# Patient Record
Sex: Female | Born: 1960 | Race: Black or African American | Hispanic: No | Marital: Single | Smoking: Former smoker
Health system: Southern US, Community
[De-identification: ages and names within clinical notes are randomized; demographics above are authoritative.]

## PROBLEM LIST (undated history)

## (undated) DIAGNOSIS — H409 Unspecified glaucoma: Secondary | ICD-10-CM

## (undated) DIAGNOSIS — I639 Cerebral infarction, unspecified: Secondary | ICD-10-CM

## (undated) DIAGNOSIS — I10 Essential (primary) hypertension: Secondary | ICD-10-CM

## (undated) DIAGNOSIS — E7800 Pure hypercholesterolemia, unspecified: Secondary | ICD-10-CM

## (undated) DIAGNOSIS — Z970 Presence of artificial eye: Secondary | ICD-10-CM

## (undated) DIAGNOSIS — E119 Type 2 diabetes mellitus without complications: Secondary | ICD-10-CM

## (undated) HISTORY — PX: OTHER SURGICAL HISTORY: SHX169

## (undated) HISTORY — PX: BREAST BIOPSY: SHX20

---

## 2019-04-02 ENCOUNTER — Emergency Department (HOSPITAL_COMMUNITY): Payer: Medicare Other

## 2019-04-02 ENCOUNTER — Encounter (HOSPITAL_COMMUNITY): Payer: Self-pay

## 2019-04-02 ENCOUNTER — Inpatient Hospital Stay (HOSPITAL_COMMUNITY)
Admission: EM | Admit: 2019-04-02 | Discharge: 2019-04-03 | DRG: 066 | Disposition: A | Discharge status: Home / Self Care | Payer: Medicare Other | Attending: Family Medicine | Admitting: Family Medicine

## 2019-04-02 ENCOUNTER — Other Ambulatory Visit: Payer: Self-pay

## 2019-04-02 DIAGNOSIS — I63429 Cerebral infarction due to embolism of unspecified anterior cerebral artery: Secondary | ICD-10-CM

## 2019-04-02 DIAGNOSIS — Z20828 Contact with and (suspected) exposure to other viral communicable diseases: Secondary | ICD-10-CM | POA: Diagnosis present

## 2019-04-02 DIAGNOSIS — E1151 Type 2 diabetes mellitus with diabetic peripheral angiopathy without gangrene: Secondary | ICD-10-CM | POA: Diagnosis present

## 2019-04-02 DIAGNOSIS — D869 Sarcoidosis, unspecified: Secondary | ICD-10-CM | POA: Diagnosis present

## 2019-04-02 DIAGNOSIS — Z9842 Cataract extraction status, left eye: Secondary | ICD-10-CM | POA: Diagnosis not present

## 2019-04-02 DIAGNOSIS — W1830XA Fall on same level, unspecified, initial encounter: Secondary | ICD-10-CM | POA: Diagnosis present

## 2019-04-02 DIAGNOSIS — H409 Unspecified glaucoma: Secondary | ICD-10-CM | POA: Diagnosis present

## 2019-04-02 DIAGNOSIS — E7800 Pure hypercholesterolemia, unspecified: Secondary | ICD-10-CM | POA: Diagnosis present

## 2019-04-02 DIAGNOSIS — Z880 Allergy status to penicillin: Secondary | ICD-10-CM | POA: Diagnosis not present

## 2019-04-02 DIAGNOSIS — Z8673 Personal history of transient ischemic attack (TIA), and cerebral infarction without residual deficits: Secondary | ICD-10-CM

## 2019-04-02 DIAGNOSIS — I10 Essential (primary) hypertension: Secondary | ICD-10-CM | POA: Diagnosis present

## 2019-04-02 DIAGNOSIS — I63412 Cerebral infarction due to embolism of left middle cerebral artery: Secondary | ICD-10-CM | POA: Diagnosis present

## 2019-04-02 DIAGNOSIS — R29702 NIHSS score 2: Secondary | ICD-10-CM | POA: Diagnosis present

## 2019-04-02 DIAGNOSIS — I361 Nonrheumatic tricuspid (valve) insufficiency: Secondary | ICD-10-CM | POA: Diagnosis not present

## 2019-04-02 DIAGNOSIS — G8311 Monoplegia of lower limb affecting right dominant side: Secondary | ICD-10-CM | POA: Diagnosis present

## 2019-04-02 DIAGNOSIS — Z6823 Body mass index (BMI) 23.0-23.9, adult: Secondary | ICD-10-CM

## 2019-04-02 DIAGNOSIS — Z79899 Other long term (current) drug therapy: Secondary | ICD-10-CM

## 2019-04-02 DIAGNOSIS — Z8249 Family history of ischemic heart disease and other diseases of the circulatory system: Secondary | ICD-10-CM

## 2019-04-02 DIAGNOSIS — R278 Other lack of coordination: Secondary | ICD-10-CM | POA: Diagnosis present

## 2019-04-02 DIAGNOSIS — I071 Rheumatic tricuspid insufficiency: Secondary | ICD-10-CM | POA: Diagnosis present

## 2019-04-02 DIAGNOSIS — R636 Underweight: Secondary | ICD-10-CM | POA: Diagnosis present

## 2019-04-02 DIAGNOSIS — E781 Pure hyperglyceridemia: Secondary | ICD-10-CM | POA: Diagnosis present

## 2019-04-02 DIAGNOSIS — I63422 Cerebral infarction due to embolism of left anterior cerebral artery: Secondary | ICD-10-CM | POA: Diagnosis present

## 2019-04-02 DIAGNOSIS — F17220 Nicotine dependence, chewing tobacco, uncomplicated: Secondary | ICD-10-CM | POA: Diagnosis present

## 2019-04-02 DIAGNOSIS — I6340 Cerebral infarction due to embolism of unspecified cerebral artery: Secondary | ICD-10-CM | POA: Insufficient documentation

## 2019-04-02 DIAGNOSIS — Z90710 Acquired absence of both cervix and uterus: Secondary | ICD-10-CM | POA: Diagnosis not present

## 2019-04-02 DIAGNOSIS — E1165 Type 2 diabetes mellitus with hyperglycemia: Secondary | ICD-10-CM | POA: Diagnosis present

## 2019-04-02 DIAGNOSIS — Z970 Presence of artificial eye: Secondary | ICD-10-CM

## 2019-04-02 DIAGNOSIS — I639 Cerebral infarction, unspecified: Secondary | ICD-10-CM | POA: Diagnosis present

## 2019-04-02 DIAGNOSIS — E785 Hyperlipidemia, unspecified: Secondary | ICD-10-CM | POA: Diagnosis present

## 2019-04-02 DIAGNOSIS — E1159 Type 2 diabetes mellitus with other circulatory complications: Secondary | ICD-10-CM | POA: Diagnosis not present

## 2019-04-02 HISTORY — DX: Essential (primary) hypertension: I10

## 2019-04-02 HISTORY — DX: Presence of artificial eye: Z97.0

## 2019-04-02 HISTORY — DX: Type 2 diabetes mellitus without complications: E11.9

## 2019-04-02 HISTORY — DX: Pure hypercholesterolemia, unspecified: E78.00

## 2019-04-02 HISTORY — DX: Unspecified glaucoma: H40.9

## 2019-04-02 HISTORY — DX: Cerebral infarction, unspecified: I63.9

## 2019-04-02 LAB — RAPID URINE DRUG SCREEN, HOSP PERFORMED
Amphetamines: NOT DETECTED
Barbiturates: NOT DETECTED
Benzodiazepines: NOT DETECTED
Cocaine: NOT DETECTED
Opiates: NOT DETECTED
Tetrahydrocannabinol: NOT DETECTED

## 2019-04-02 LAB — CBC
HCT: 42 % (ref 36.0–46.0)
Hemoglobin: 13.9 g/dL (ref 12.0–15.0)
MCH: 30.3 pg (ref 26.0–34.0)
MCHC: 33.1 g/dL (ref 30.0–36.0)
MCV: 91.7 fL (ref 80.0–100.0)
Platelets: 353 10*3/uL (ref 150–400)
RBC: 4.58 MIL/uL (ref 3.87–5.11)
RDW: 12.7 % (ref 11.5–15.5)
WBC: 10 10*3/uL (ref 4.0–10.5)
nRBC: 0 % (ref 0.0–0.2)

## 2019-04-02 LAB — COMPREHENSIVE METABOLIC PANEL
ALT: 32 U/L (ref 0–44)
AST: 22 U/L (ref 15–41)
Albumin: 3.5 g/dL (ref 3.5–5.0)
Alkaline Phosphatase: 103 U/L (ref 38–126)
Anion gap: 12 (ref 5–15)
BUN: 15 mg/dL (ref 6–20)
CO2: 25 mmol/L (ref 22–32)
Calcium: 9.3 mg/dL (ref 8.9–10.3)
Chloride: 102 mmol/L (ref 98–111)
Creatinine, Ser: 0.88 mg/dL (ref 0.44–1.00)
GFR calc Af Amer: >60 mL/min (ref >60)
GFR calc non Af Amer: >60 mL/min (ref >60)
Glucose, Bld: 172 mg/dL — ABNORMAL HIGH (ref 70–99)
Potassium: 4.1 mmol/L (ref 3.5–5.1)
Sodium: 139 mmol/L (ref 135–145)
Total Bilirubin: 0.6 mg/dL (ref 0.3–1.2)
Total Protein: 7.5 g/dL (ref 6.5–8.1)

## 2019-04-02 LAB — DIFFERENTIAL
Abs Immature Granulocytes: 0.05 10*3/uL (ref 0.00–0.07)
Basophils Absolute: 0.1 10*3/uL (ref 0.0–0.1)
Basophils Relative: 1 %
Eosinophils Absolute: 0.2 10*3/uL (ref 0.0–0.5)
Eosinophils Relative: 2 %
Immature Granulocytes: 1 %
Lymphocytes Relative: 39 %
Lymphs Abs: 3.9 10*3/uL (ref 0.7–4.0)
Monocytes Absolute: 0.9 10*3/uL (ref 0.1–1.0)
Monocytes Relative: 9 %
Neutro Abs: 4.8 10*3/uL (ref 1.7–7.7)
Neutrophils Relative %: 48 %

## 2019-04-02 LAB — I-STAT CHEM 8, ED
BUN: 18 mg/dL (ref 6–20)
Calcium, Ion: 1.15 mmol/L (ref 1.15–1.40)
Chloride: 102 mmol/L (ref 98–111)
Creatinine, Ser: 0.7 mg/dL (ref 0.44–1.00)
Glucose, Bld: 168 mg/dL — ABNORMAL HIGH (ref 70–99)
HCT: 43 % (ref 36.0–46.0)
Hemoglobin: 14.6 g/dL (ref 12.0–15.0)
Potassium: 3.9 mmol/L (ref 3.5–5.1)
Sodium: 139 mmol/L (ref 135–145)
TCO2: 28 mmol/L (ref 22–32)

## 2019-04-02 LAB — CBG MONITORING, ED
Glucose-Capillary: 174 mg/dL — ABNORMAL HIGH (ref 70–99)
Glucose-Capillary: 106 mg/dL — ABNORMAL HIGH (ref 70–99)
Glucose-Capillary: 186 mg/dL — ABNORMAL HIGH (ref 70–99)

## 2019-04-02 LAB — URINALYSIS, ROUTINE W REFLEX MICROSCOPIC
Bilirubin Urine: NEGATIVE
Glucose, UA: NEGATIVE mg/dL
Hgb urine dipstick: NEGATIVE
Ketones, ur: NEGATIVE mg/dL
Leukocytes,Ua: NEGATIVE
Nitrite: NEGATIVE
Protein, ur: NEGATIVE mg/dL
Specific Gravity, Urine: 1.023 (ref 1.005–1.030)
pH: 7 (ref 5.0–8.0)

## 2019-04-02 LAB — SARS CORONAVIRUS 2 (TAT 6-24 HRS): SARS Coronavirus 2: NEGATIVE

## 2019-04-02 LAB — PROTIME-INR
INR: 1 (ref 0.8–1.2)
Prothrombin Time: 12.6 seconds (ref 11.4–15.2)

## 2019-04-02 LAB — I-STAT BETA HCG BLOOD, ED (MC, WL, AP ONLY): I-stat hCG, quantitative: <5 m[IU]/mL (ref <5)

## 2019-04-02 LAB — HIV ANTIBODY (ROUTINE TESTING W REFLEX): HIV Screen 4th Generation wRfx: NONREACTIVE

## 2019-04-02 LAB — APTT: aPTT: 22 seconds — ABNORMAL LOW (ref 24–36)

## 2019-04-02 MED ORDER — ACETAMINOPHEN 650 MG RE SUPP: 650.0000 mg | RECTAL | Status: DC | PRN

## 2019-04-02 MED ORDER — HEPARIN SODIUM (PORCINE) 5000 UNIT/ML IJ SOLN
5000.0000 [IU] | Freq: Three times a day (TID) | INTRAMUSCULAR | Status: DC
  Administered 2019-04-02 – 2019-04-03 (×2): 5000 [IU] via SUBCUTANEOUS
  Filled 2019-04-02 (×2): qty 1

## 2019-04-02 MED ORDER — STROKE: EARLY STAGES OF RECOVERY BOOK
Freq: Once | Status: DC
  Filled 2019-04-02: qty 1

## 2019-04-02 MED ORDER — ACETAMINOPHEN 325 MG PO TABS: 650.0000 mg | ORAL_TABLET | ORAL | Status: DC | PRN

## 2019-04-02 MED ORDER — INSULIN ASPART 100 UNIT/ML ~~LOC~~ SOLN
0.0000 [IU] | Freq: Three times a day (TID) | SUBCUTANEOUS | Status: DC
  Administered 2019-04-03 (×2): 1 [IU] via SUBCUTANEOUS
  Administered 2019-04-03: 5 [IU] via SUBCUTANEOUS

## 2019-04-02 MED ORDER — SENNOSIDES-DOCUSATE SODIUM 8.6-50 MG PO TABS: 1.0000 | ORAL_TABLET | Freq: Every evening | ORAL | Status: DC | PRN

## 2019-04-02 MED ORDER — IOHEXOL 350 MG/ML SOLN
75.0000 mL | Freq: Once | INTRAVENOUS | Status: AC | PRN
  Administered 2019-04-02: 75 mL via INTRAVENOUS

## 2019-04-02 MED ORDER — METOCLOPRAMIDE HCL 5 MG/ML IJ SOLN: 10.0000 mg | Freq: Once | INTRAMUSCULAR | Status: DC

## 2019-04-02 MED ORDER — METOPROLOL TARTRATE 12.5 MG HALF TABLET
12.5000 mg | ORAL_TABLET | Freq: Two times a day (BID) | ORAL | Status: DC
  Administered 2019-04-02 – 2019-04-03 (×2): 12.5 mg via ORAL
  Filled 2019-04-02 (×2): qty 1

## 2019-04-02 MED ORDER — ASPIRIN 325 MG PO TABS
325.0000 mg | ORAL_TABLET | Freq: Every day | ORAL | Status: DC
  Administered 2019-04-02 – 2019-04-03 (×2): 325 mg via ORAL
  Filled 2019-04-02 (×2): qty 1

## 2019-04-02 MED ORDER — ACETAMINOPHEN 160 MG/5ML PO SOLN: 650.0000 mg | ORAL | Status: DC | PRN

## 2019-04-02 MED ORDER — ATORVASTATIN CALCIUM 80 MG PO TABS
80.0000 mg | ORAL_TABLET | Freq: Every day | ORAL | Status: DC
  Administered 2019-04-02 – 2019-04-03 (×2): 80 mg via ORAL
  Filled 2019-04-02 (×2): qty 1

## 2019-04-02 MED ORDER — INSULIN ASPART 100 UNIT/ML ~~LOC~~ SOLN: 0.0000 [IU] | Freq: Every day | SUBCUTANEOUS | Status: DC

## 2019-04-02 MED ORDER — LORAZEPAM 2 MG/ML IJ SOLN
1.0000 mg | Freq: Once | INTRAMUSCULAR | Status: AC
  Administered 2019-04-02: 1 mg via INTRAVENOUS
  Filled 2019-04-02: qty 1

## 2019-04-02 NOTE — ED Notes (Signed)
DaughterConley Rolls, [phone]

## 2019-04-02 NOTE — ED Notes (Signed)
PAGED ADMITTING PER RN  

## 2019-04-02 NOTE — ED Notes (Signed)
Activated code stroke per Dr.Allen

## 2019-04-02 NOTE — ED Notes (Signed)
Report given to Amanda, RN on 3W.

## 2019-04-02 NOTE — ED Provider Notes (Signed)
[facility] EMERGENCY DEPARTMENT Provider Note   CSN: [id number] Arrival date & time: 04/02/19  1334     History Chief Complaint  Patient presents with  . Weakness    Kathleen George is a 58 y.o. female.  58 year old female presents with acute onset of right lower extremity weakness when she was standing today while brushing her teeth.  States that she had to drag her right leg and sit down.  This occurred at approximately 8:10 AM today.  She denies any headache or visual changes.  No neck discomfort.  No chest or abdominal comfort.  No weakness in any other extremities.  No confusion or dysarthria.  States she has a remote history of a stroke when she was 58 years old.  Unclear of circumstances about that.  EMS called and patient CBG was over 100.  Possible intermittent seizure activity described patient having a blank stare and 2 fingers on her left hand were twitching.  No postictal period.  No bowel or bladder dysfunction.  No treatment use prior to arrival.        History reviewed. No pertinent past medical history.  There are no problems to display for this patient.      OB History   No obstetric history on file.     No family history on file.  Social History   Tobacco Use  . Smoking status: Not on file  Substance Use Topics  . Alcohol use: Not on file  . Drug use: Not on file    Home Medications Prior to Admission medications   Not on File    Allergies    Penicillins  Review of Systems   Review of Systems  All other systems reviewed and are negative.   Physical Exam Updated Vital Signs BP (!) 119/95 (BP Location: Right Arm)   Pulse 85   Temp 98.6 F (37 C) (Oral)   Resp 18   Ht 1.575 m (5\' 2" )   SpO2 98%   Physical Exam Vitals and nursing note reviewed.  Constitutional:      General: She is not in acute distress.    Appearance: Normal appearance. She is well-developed. She is not toxic-appearing.  HENT:     Head:  Normocephalic and atraumatic.  Eyes:     General: Lids are normal.     Conjunctiva/sclera: Conjunctivae normal.     Pupils: Pupils are equal, round, and reactive to light.  Neck:     Thyroid: No thyroid mass.     Trachea: No tracheal deviation.  Cardiovascular:     Rate and Rhythm: Normal rate and regular rhythm.     Heart sounds: Normal heart sounds. No murmur. No gallop.   Pulmonary:     Effort: Pulmonary effort is normal. No respiratory distress.     Breath sounds: Normal breath sounds. No stridor. No decreased breath sounds, wheezing, rhonchi or rales.  Abdominal:     General: Bowel sounds are normal. There is no distension.     Palpations: Abdomen is soft.     Tenderness: There is no abdominal tenderness. There is no rebound.  Musculoskeletal:        General: No tenderness. Normal range of motion.     Cervical back: Normal range of motion and neck supple.  Skin:    General: Skin is warm and dry.     Findings: No abrasion or rash.  Neurological:     Mental Status: She is alert and oriented to person, place,  and time.     GCS: GCS eye subscore is 4. GCS verbal subscore is 5. GCS motor subscore is 6.     Cranial Nerves: No cranial nerve deficit or dysarthria.     Sensory: No sensory deficit.     Motor: Weakness present. No tremor or seizure activity.     Coordination: Finger-Nose-Finger Test normal.     Comments: Strength is 2/5 at right lower extremity.  5/5 throughout the other extremities.  Psychiatric:        Attention and Perception: Attention normal.        Speech: Speech normal.        Behavior: Behavior normal.     ED Results / Procedures / Treatments   Labs (all labs ordered are listed, but only abnormal results are displayed) Labs Reviewed  CBG MONITORING, ED - Abnormal; Notable for the following components:      Result Value   Glucose-Capillary 174 (*)    All other components within normal limits    EKG EKG Interpretation  Date/Time:  Thursday April 02 2019 13:42:25 EST Ventricular Rate:  89 PR Interval:    QRS Duration: 74 QT Interval:  369 QTC Calculation: 449 R Axis:   45 Text Interpretation: Sinus rhythm No old tracing to compare Confirmed by Lorre Nick ([id number]) on 04/02/2019 1:46:18 PM   Radiology No results found.  Procedures Procedures (including critical care time)  Medications Ordered in ED Medications - No data to display  ED Course  I have reviewed the triage vital signs and the nursing notes.  Pertinent labs & imaging results that were available during my care of the patient were reviewed by me and considered in my medical decision making (see chart for details).    MDM Rules/Calculators/A&P                      2:28 PM Patient seen by neurology and patient's head CT without acute findings.  Patient is LVO negative.  Some concern for possible migraine equivalent as causing her symptoms.  They feel that patient's strength is 5 of 5 in the right lower extremity.  Are requesting MRI of brain which is been ordered by them and based on that result patient may be discharged home if that is negative and her headache is improved after receiving headache cocktail here.  Will sign out to Dr. Jacqulyn Bath Final Clinical Impression(s) / ED Diagnoses Final diagnoses:  None    Rx / DC Orders ED Discharge Orders    None       Lorre Nick, MD 04/02/19 1431

## 2019-04-02 NOTE — ED Provider Notes (Signed)
Blood pressure (!) 127/94, pulse 87, temperature 98.6 F (37 C), temperature source Oral, resp. rate 15, height 5\' 2"  (1.575 m), weight 59 kg, SpO2 99 %.  Assuming care from Dr. Zenia Resides.  In short, Karianne Nogueira is a 58 y.o. female with a chief complaint of Weakness .  Refer to the original H&P for additional details.  The current plan of care is to f/u after MRI and reassess. Deficits resolved at this time. Added CXR with CT imaging recommended with ? Adenopathy on CT imaging.   04:22 PM  MRI as below. Plan for admit. Updated patient. PCP in [place], [place]. Unassigned admit paged.   IMPRESSION:  1. Scattered areas of acute/subacute nonhemorrhagic infarction  involving the left ACA distribution, ACA MCA watershed, and anterior  genu of the corpus callosum on the left.  2. No other acute intracranial abnormality.  3. Atrophy and white matter disease is moderately advanced for age.  This likely reflects the sequela of chronic microvascular ischemia.  4. Bilateral mastoid effusions, left greater than right. No  obstructing nasopharyngeal lesion is present.    The above was relayed via text pager to [facility] on  04/02/2019 at 16:11 .    Discussed patient's case with Internal Medicine to request admission. Patient and family (if present) updated with plan. Care transferred to Medicine service.  I reviewed all nursing notes, vitals, pertinent old records, EKGs, labs, imaging (as available).    Margette Fast, MD 04/02/19 1630

## 2019-04-02 NOTE — Consult Note (Signed)
Neurology Consultation  Reason for Consult: Code stroke Referring Physician: Zenia Resides  CC: Right-sided weakness  History is obtained from: Patient  HPI: Kathleen George is a 58 y.o. female with history of stroke, hypertension, hypercholesterolemia and diabetes.  Patient got up and was at her normal state this morning.  At approximately 8 10 in the morning she was brushing her teeth and felt as though her right leg gave out on her.  She walked to the couch and laid down for quite a while hoping that the sensation and strength would come back.  Due to no resolution she did come to the ED to get evaluated.  Currently she states that she has decreased sensation on the right arm and leg, and feels as though her right leg continues to be weak.  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   ED course CT, CTA head and neck  Chart review (no prior chart in epic)  LKW: 810 on 04/02/2019 tpa given?: no, out of window Premorbid modified Rankin scale (mRS): 0 NIH stroke scale of 2   Past Medical History:  Diagnosis Date  . Diabetes mellitus without complication ([place])   . Hypercholesterolemia   . Hypertension   . Stroke [facility])     Family History  Problem Relation Age of Onset  . Hypertension Mother   . Hypertension Father    Social History:   reports that she has quit smoking. She has never used smokeless tobacco. She reports previous alcohol use. She reports that she does not use drugs.  Medications  Current Facility-Administered Medications:  .  metoCLOPramide (REGLAN) injection 10 mg, 10 mg, Intravenous, Once, Lacretia Leigh, MD No current outpatient medications on file.   Exam: Current vital signs: BP (!) 127/94   Pulse 87   Temp 98.6 F (37 C) (Oral)   Resp 15   Ht 5\' 2"  (1.575 m)   SpO2 99%  Vital signs in last 24  hours: Temp:  [98.6 F (37 C)] 98.6 F (37 C) (12/24 1344) Pulse Rate:  [85-90] 87 (12/24 1416) Resp:  [15-18] 15 (12/24 1416) BP: (119-127)/(84-96) 127/94 (12/24 1416) SpO2:  [96 %-100 %] 99 % (12/24 1416)  ROS:     General ROS: negative for - chills, fatigue, fever, night sweats, weight gain or weight loss Psychological ROS: negative for - behavioral disorder, hallucinations, memory difficulties, mood swings or suicidal ideation Ophthalmic ROS: Positive for - blurry vision,  loss of vision ENT ROS: negative for - epistaxis, nasal discharge, oral lesions, sore throat, tinnitus or vertigo Allergy and Immunology ROS: negative for - hives or itchy/watery eyes Hematological and Lymphatic ROS: negative for - bleeding problems, bruising or swollen lymph nodes Endocrine ROS: negative for - galactorrhea, hair pattern changes, polydipsia/polyuria or temperature intolerance Respiratory ROS: negative for - cough, hemoptysis, shortness of breath or wheezing Cardiovascular ROS: negative for - chest pain, dyspnea on exertion, edema or irregular heartbeat Gastrointestinal ROS: negative for - abdominal pain, diarrhea, hematemesis, nausea/vomiting or stool incontinence Genito-Urinary ROS: negative for - dysuria, hematuria, incontinence or urinary frequency/urgency Musculoskeletal ROS: Positive for -  muscular weakness and pain Neurological ROS: as noted in HPI Dermatological ROS: negative for rash and skin lesion changes   Physical Exam   Constitutional: Appears well-developed and well-nourished.  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  Cardiovascular: Normal rate and regular rhythm.  Respiratory: Effort normal, non-labored breathing GI: Soft.  No distension. There is no tenderness.  Skin: WDI  Neuro: Mental Status: Patient is awake, alert, oriented to person,  place, month, year, and situation. Patient is able to give a clear and coherent history. No signs of aphasia or neglect Cranial Nerves: II: Decreased in all peripheral fields III,IV, VI: Right eye EOMI without ptosis with pupil reactive.  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  VIII: hearing is intact to voice X: Palat elevates symmetrically XI: Shoulder shrug is symmetric. XII: tongue is midline without atrophy or fasciculations.  Motor: Tone is normal. Bulk is normal. 5/5 strength was present in bilateral upper extremities and left lower extremity.  Initially patient's right lower extremity was nonantigravity however on secondary exam patient did have drift but showed good 5/5 strength distally in 4/5 strength when tested Sensory: Decreased on right arm and leg Deep Tendon Reflexes: 2+ and symmetric in the biceps and patellae.  Plantars: Toes are downgoing bilaterally.  Cerebellar: FNF and HKS are intact bilaterally  Labs I have reviewed labs in epic and the results pertinent to this consultation are:   CBC    Component Value Date/Time   WBC 10.0 04/02/2019 1410   RBC 4.58 04/02/2019 1410   HGB 14.6 04/02/2019 1422   HCT 43.0 04/02/2019 1422   PLT 353 04/02/2019 1410   MCV 91.7 04/02/2019 1410   MCH 30.3 04/02/2019 1410   MCHC 33.1 04/02/2019 1410   RDW 12.7 04/02/2019 1410   LYMPHSABS 3.9 04/02/2019 1410   MONOABS 0.9 04/02/2019 1410   EOSABS 0.2 04/02/2019 1410   BASOSABS 0.1 04/02/2019 1410    CMP     Component Value Date/Time   NA 139 04/02/2019 1422   K 3.9 04/02/2019 1422   CL 102 04/02/2019 1422   GLUCOSE 168 (H) 04/02/2019 1422   BUN 18 04/02/2019 1422   CREATININE 0.70 04/02/2019 1422    Lipid Panel  No results found for: CHOL, TRIG, HDL, CHOLHDL, VLDL, LDLCALC, LDLDIRECT   Imaging I have reviewed the images obtained:  CT-scan of the brain-no acute intracranial hemorrhage or  evidence of acute infarct  CTA head and neck-fluted extracranial right vertebral artery, which may be chronic.  There is a reconstruction at the level of the V3 segment.  Otherwise no large vessel occlusion or hemodynamically significant stenosis.  It should be noted there was also partial image abdominal thoracic findings including pulmonary groundglass and nodular opacities and conglomerate mediastinal adenopathy  Felicie MornDavid Smith PA-C [facility] [phone]  M-F  (9:00 am- 5:00 PM)  04/02/2019, 2:49 PM    Assessment:  58 year old female presented to hospital secondary to right-sided lower extremity weakness and right-sided decreased sensation.  Patient was out of the window for TPA.  CT and CTA of head and neck did not show acute stroke or large vessel occlusion.  For that reason patient was not interventional candidate.  Exam initially showed significant weakness in the right leg however later showed improvement with strength.  I suspect that she has had an ischemic infarct.  Recommendations: -MRI brain.  -If MRI brain is positive---Recommend #Transthoracic Echo,  # Start patient on ASA 325mg  daily,   #Start or continue Atorvastatin 80 mg/other high intensity statin # BP goal: permissive HTN upto 220/120 mmHg # HBAIC  and Lipid profile # Telemetry monitoring # Frequent neuro checks # NPO until passes stroke swallow screen # please page stroke NP  Or  PA  Or MD from 8am -4 pm  as this patient from this time will be  followed by the stroke.   You can look them up on [email or web address]  Password [email or web address]  Ritta Slot, MD [facility] [phone]  If 7pm- 7am, please page neurology on call as listed in AMION.

## 2019-04-02 NOTE — ED Notes (Signed)
ED TO INPATIENT HANDOFF REPORT  ED Nurse Name and Phone #: Percival Spanish [phone]  S Name/Age/Gender Kathleen George 58 y.o. female Room/Bed: 032C/032C  Code Status   Code Status: Full Code  Home/SNF/Other Home Patient oriented to: self, place, time and situation Is this baseline? Yes   Triage Complete: Triage complete  Chief Complaint Stroke [facility]) [I63.9]  Triage Note Pt arrived via [facility] from home. Pt woke up at 0800 this morning and felt fine, after she went and brushed her teeth she started to have right arm and right leg weakness. Pt has right arm numbness and tingling. When first EMS was at scene and talked to pt, pt had 3 episodes of focal seizures. Pt had a blank stare and two of her fingers on left hand was twitching. Pt is A&Ox4. Pt is NSR on monitor. VS WNL     Allergies Allergies  Allergen Reactions  . Penicillins Anaphylaxis    Did it involve swelling of the face/tongue/throat, SOB, or low BP? Yes Did it involve sudden or severe rash/hives, skin peeling, or any reaction on the inside of your mouth or nose? N/A Did you need to seek medical attention at a hospital or doctor's office? N/A When did it last happen?Child If all above answers are "NO", may proceed with cephalosporin use.    Level of Care/Admitting Diagnosis ED Disposition    ED Disposition Condition Comment   Admit  Hospital Area: [facility] [[id number]]  Level of Care: Telemetry Cardiac [[id number]]  Covid Evaluation: Asymptomatic Screening Protocol (No Symptoms)  Diagnosis: Stroke [facility]) [[id number]]  Admitting Physician: Marchia Meiers [[id number]]  Attending Physician: Westley Chandler [[id number]]  Estimated length of stay: past midnight tomorrow  Certification:: I certify this patient will need inpatient services for at least 2 midnights       B Medical/Surgery History Past Medical History:  Diagnosis Date  . Diabetes mellitus without complication (HCC)   . Glaucoma   .  Hypercholesterolemia   . Hypertension   . Prosthetic eye globe   . Stroke [facility])    Past Surgical History:  Procedure Laterality Date  . BREAST BIOPSY    . left knee surg       A IV Location/Drains/Wounds Patient Lines/Drains/Airways Status   Active Line/Drains/Airways    Name:   Placement date:   Placement time:   Site:   Days:   Peripheral IV 04/02/19 Left Antecubital   04/02/19    --    Antecubital   less than 1          Intake/Output Last 24 hours No intake or output data in the 24 hours ending 04/02/19 2237  Labs/Imaging Results for orders placed or performed during the hospital encounter of 04/02/19 (from the past 48 hour(s))  CBG monitoring, ED     Status: Abnormal   Collection Time: 04/02/19  1:41 PM  Result Value Ref Range   Glucose-Capillary 174 (H) 70 - 99 mg/dL  Protime-INR     Status: None   Collection Time: 04/02/19  2:10 PM  Result Value Ref Range   Prothrombin Time 12.6 11.4 - 15.2 seconds   INR 1.0 0.8 - 1.2    Comment: (NOTE) INR goal varies based on device and disease states. Performed at [facility], [address] [place], [place] [address]   APTT     Status: Abnormal   Collection Time: 04/02/19  2:10 PM  Result Value Ref Range   aPTT 22 (L) 24 -  36 seconds    Comment: Performed at [facility], [address] [place], [place] [address]  CBC     Status: None   Collection Time: 04/02/19  2:10 PM  Result Value Ref Range   WBC 10.0 4.0 - 10.5 K/uL   RBC 4.58 3.87 - 5.11 MIL/uL   Hemoglobin 13.9 12.0 - 15.0 g/dL   HCT 08.642.0 57.836.0 - 46.946.0 %   MCV 91.7 80.0 - 100.0 fL   MCH 30.3 26.0 - 34.0 pg   MCHC 33.1 30.0 - 36.0 g/dL   RDW 62.912.7 52.811.5 - 41.315.5 %   Platelets 353 150 - 400 K/uL   nRBC 0.0 0.0 - 0.2 %    Comment: Performed at [facility], [address] [place], [place] [address]  Differential     Status: None   Collection Time: 04/02/19  2:10 PM  Result Value Ref Range   Neutrophils Relative % 48 %   Neutro Abs 4.8 1.7 -  7.7 K/uL   Lymphocytes Relative 39 %   Lymphs Abs 3.9 0.7 - 4.0 K/uL   Monocytes Relative 9 %   Monocytes Absolute 0.9 0.1 - 1.0 K/uL   Eosinophils Relative 2 %   Eosinophils Absolute 0.2 0.0 - 0.5 K/uL   Basophils Relative 1 %   Basophils Absolute 0.1 0.0 - 0.1 K/uL   Immature Granulocytes 1 %   Abs Immature Granulocytes 0.05 0.00 - 0.07 K/uL    Comment: Performed at [facility], [address] [place], [place] [address]  Comprehensive metabolic panel     Status: Abnormal   Collection Time: 04/02/19  2:10 PM  Result Value Ref Range   Sodium 139 135 - 145 mmol/L   Potassium 4.1 3.5 - 5.1 mmol/L   Chloride 102 98 - 111 mmol/L   CO2 25 22 - 32 mmol/L   Glucose, Bld 172 (H) 70 - 99 mg/dL   BUN 15 6 - 20 mg/dL   Creatinine, Ser 3.660.88 0.44 - 1.00 mg/dL   Calcium 9.3 8.9 - 44.010.3 mg/dL   Total Protein 7.5 6.5 - 8.1 g/dL   Albumin 3.5 3.5 - 5.0 g/dL   AST 22 15 - 41 U/L   ALT 32 0 - 44 U/L   Alkaline Phosphatase 103 38 - 126 U/L   Total Bilirubin 0.6 0.3 - 1.2 mg/dL   GFR calc non Af Amer >60 >60 mL/min   GFR calc Af Amer >60 >60 mL/min   Anion gap 12 5 - 15    Comment: Performed at [facility], [address] [place], [place] [address]  I-Stat beta hCG blood, ED     Status: None   Collection Time: 04/02/19  2:19 PM  Result Value Ref Range   I-stat hCG, quantitative <5.0 <5 mIU/mL   Comment 3            Comment:   GEST. AGE      CONC.  (mIU/mL)   <=1 WEEK        5 - 50     2 WEEKS       50 - 500     3 WEEKS       100 - 10,000     4 WEEKS     1,000 - 30,000        FEMALE AND NON-PREGNANT FEMALE:     LESS THAN 5 mIU/mL   I-stat chem 8, ED  Status: Abnormal   Collection Time: 04/02/19  2:22 PM  Result Value Ref Range   Sodium 139 135 - 145 mmol/L   Potassium 3.9 3.5 - 5.1 mmol/L   Chloride 102 98 - 111 mmol/L   BUN 18 6 - 20 mg/dL   Creatinine, Ser 1.61 0.44 - 1.00 mg/dL   Glucose, Bld 096 (H) 70 - 99 mg/dL   Calcium, Ion 0.45 4.09 - 1.40 mmol/L   TCO2 28  22 - 32 mmol/L   Hemoglobin 14.6 12.0 - 15.0 g/dL   HCT 81.1 91.4 - 78.2 %  SARS CORONAVIRUS 2 (TAT 6-24 HRS) Nasopharyngeal Nasopharyngeal Swab     Status: None   Collection Time: 04/02/19  5:15 PM   Specimen: Nasopharyngeal Swab  Result Value Ref Range   SARS Coronavirus 2 NEGATIVE NEGATIVE    Comment: (NOTE) SARS-CoV-2 target nucleic acids are NOT DETECTED. The SARS-CoV-2 RNA is generally detectable in upper and lower respiratory specimens during the acute phase of infection. Negative results do not preclude SARS-CoV-2 infection, do not rule out co-infections with other pathogens, and should not be used as the sole basis for treatment or other patient management decisions. Negative results must be combined with clinical observations, patient history, and epidemiological information. The expected result is Negative. Fact Sheet for Patients: HairSlick.no Fact Sheet for Healthcare Providers: quierodirigir.com This test is not yet approved or cleared by the [place] FDA and  has been authorized for detection and/or diagnosis of SARS-CoV-2 by FDA under an Emergency Use Authorization (EUA). This EUA will remain  in effect (meaning this test can be used) for the duration of the COVID-19 declaration under Section 56 4(b)(1) of the Act, 21 U.S.C. section 360bbb-3(b)(1), unless the authorization is terminated or revoked sooner. Performed at [facility], [address], [place], [place] [address]   Urine rapid drug screen (hosp performed)     Status: None   Collection Time: 04/02/19  5:30 PM  Result Value Ref Range   Opiates NONE DETECTED NONE DETECTED   Cocaine NONE DETECTED NONE DETECTED   Benzodiazepines NONE DETECTED NONE DETECTED   Amphetamines NONE DETECTED NONE DETECTED   Tetrahydrocannabinol NONE DETECTED NONE DETECTED   Barbiturates NONE DETECTED NONE DETECTED    Comment: (NOTE) DRUG SCREEN FOR MEDICAL  PURPOSES ONLY.  IF CONFIRMATION IS NEEDED FOR ANY PURPOSE, NOTIFY LAB WITHIN 5 DAYS. LOWEST DETECTABLE LIMITS FOR URINE DRUG SCREEN Drug Class                     Cutoff (ng/mL) Amphetamine and metabolites    1000 Barbiturate and metabolites    200 Benzodiazepine                 200 Tricyclics and metabolites     300 Opiates and metabolites        300 Cocaine and metabolites        300 THC                            50 Performed at [facility], [address], [place], [place] [address]   Urinalysis, Routine w reflex microscopic     Status: Abnormal   Collection Time: 04/02/19  5:30 PM  Result Value Ref Range   Color, Urine STRAW (A) YELLOW   APPearance CLEAR CLEAR   Specific Gravity, Urine 1.023 1.005 - 1.030   pH 7.0 5.0 - 8.0  Glucose, UA NEGATIVE NEGATIVE mg/dL   Hgb urine dipstick NEGATIVE NEGATIVE   Bilirubin Urine NEGATIVE NEGATIVE   Ketones, ur NEGATIVE NEGATIVE mg/dL   Protein, ur NEGATIVE NEGATIVE mg/dL   Nitrite NEGATIVE NEGATIVE   Leukocytes,Ua NEGATIVE NEGATIVE    Comment: Performed at [facility], [address], [place], [place] [address]  HIV Antibody (routine testing w rflx)     Status: None   Collection Time: 04/02/19  5:37 PM  Result Value Ref Range   HIV Screen 4th Generation wRfx NON REACTIVE NON REACTIVE    Comment: Performed at [facility], [address] [place], [place] [address]  CBG monitoring, ED     Status: Abnormal   Collection Time: 04/02/19  6:48 PM  Result Value Ref Range   Glucose-Capillary 106 (H) 70 - 99 mg/dL  CBG monitoring, ED     Status: Abnormal   Collection Time: 04/02/19  9:45 PM  Result Value Ref Range   Glucose-Capillary 186 (H) 70 - 99 mg/dL   CT ANGIO HEAD W OR WO CONTRAST  Result Date: 04/02/2019 CLINICAL DATA:  Right-sided weakness EXAM: CT ANGIOGRAPHY HEAD AND NECK TECHNIQUE: Multidetector CT imaging of the head and neck was performed using the standard protocol during bolus administration of  intravenous contrast. Multiplanar CT image reconstructions and MIPs were obtained to evaluate the vascular anatomy. Carotid stenosis measurements (when applicable) are obtained utilizing NASCET criteria, using the distal internal carotid diameter as the denominator. CONTRAST:  75mL OMNIPAQUE IOHEXOL 350 MG/ML SOLN COMPARISON:  None. FINDINGS: CTA NECK FINDINGS Aortic arch: Great vessel origins are patent. Right carotid system: Common, internal, and external carotid arteries are patent. There is no measurable stenosis or evidence of dissection. Left carotid system: Common, internal, and external carotid arteries are patent. There is no measurable stenosis or evidence of dissection. Vertebral arteries: Left vertebral artery is patent. There is no visible enhancement of the right vertebral artery until the V3 segment. Skeleton: Multilevel degenerative changes of the cervical spine. Other neck: No neck mass or adenopathy. Upper chest: There is mild interstitial thickening, patchy ground-glass opacities and scattered nodular opacities. There is conglomerate mediastinal adenopathy. Review of the MIP images confirms the above findings CTA HEAD FINDINGS Anterior circulation: Intracranial internal carotid arteries are patent with minor calcified plaque. Middle and anterior cerebral arteries are patent. Posterior circulation: Intracranial vertebral arteries are patent. Basilar artery is patent. Posterior cerebral arteries are patent. Venous sinuses: As permitted by contrast timing, patent. 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 There is reconstitution at the level of the V3 segment. Otherwise, no large vessel occlusion or hemodynamically significant stenosis. Partially imaged abnormal thoracic findings including pulmonary ground-glass and nodular opacities and conglomerate mediastinal adenopathy. Dedicated chest imaging is  recommended. These results were communicated to Dr. Amada Jupiter at 2:25 pmon 12/24/2020by text page via the [facility] messaging system. Electronically Signed   By: Guadlupe Spanish M.D.   On: 04/02/2019 14:30   CT ANGIO NECK W OR WO CONTRAST  Result Date: 04/02/2019 CLINICAL DATA:  Right-sided weakness EXAM: CT ANGIOGRAPHY HEAD AND NECK TECHNIQUE: Multidetector CT imaging of the head and neck was performed using the standard protocol during bolus administration of intravenous contrast. Multiplanar CT image reconstructions and MIPs were obtained to evaluate the vascular anatomy. Carotid stenosis measurements (when applicable) are obtained utilizing NASCET criteria, using the distal internal carotid diameter as the denominator. CONTRAST:  75mL OMNIPAQUE IOHEXOL  350 MG/ML SOLN COMPARISON:  None. FINDINGS: CTA NECK FINDINGS Aortic arch: Great vessel origins are patent. Right carotid system: Common, internal, and external carotid arteries are patent. There is no measurable stenosis or evidence of dissection. Left carotid system: Common, internal, and external carotid arteries are patent. There is no measurable stenosis or evidence of dissection. Vertebral arteries: Left vertebral artery is patent. There is no visible enhancement of the right vertebral artery until the V3 segment. Skeleton: Multilevel degenerative changes of the cervical spine. Other neck: No neck mass or adenopathy. Upper chest: There is mild interstitial thickening, patchy ground-glass opacities and scattered nodular opacities. There is conglomerate mediastinal adenopathy. Review of the MIP images confirms the above findings CTA HEAD FINDINGS Anterior circulation: Intracranial internal carotid arteries are patent with minor calcified plaque. Middle and anterior cerebral arteries are patent. Posterior circulation: Intracranial vertebral arteries are patent. Basilar artery is patent. Posterior cerebral arteries are patent. Venous sinuses: As permitted by  contrast timing, patent. 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 There is reconstitution at the level of the V3 segment. Otherwise, no large vessel occlusion or hemodynamically significant stenosis. Partially imaged abnormal thoracic findings including pulmonary ground-glass and nodular opacities and conglomerate mediastinal adenopathy. Dedicated chest imaging is recommended. These results were communicated to Dr. Leonel Ramsay at 2:25 pmon 12/24/2020by text page via the [facility] messaging system. Electronically Signed   By: Macy Mis M.D.   On: 04/02/2019 14:30   MR BRAIN WO CONTRAST  Result Date: 04/02/2019 CLINICAL DATA:  Acute onset of right lower extremity weakness beginning 7.5 hours ago. EXAM: MRI HEAD WITHOUT CONTRAST TECHNIQUE: Multiplanar, multiecho pulse sequences of the brain and surrounding structures were obtained without intravenous contrast. COMPARISON:  CTA head and neck 03/03/2019 FINDINGS: Brain: Scattered areas of restricted diffusion are present throughout the left ACA distribution. Many of these are along the ACA/ MCA watershed. There is restricted diffusion involving the anterior genu of the corpus callosum on the left. Single right-sided lesion is present anteriorly in the medial right frontal lobe measuring 6.5 mm. The T2 hyperintense lesion in the left corona radiata does not show restricted diffusion. There is T2 shine through. T2 signal changes are associated with the areas of acute infarction. Periventricular T2 hyperintensities are present separately as well, moderately advanced for age. Vascular: Flow is present in the major intracranial arteries. Skull and upper cervical spine: Degenerative changes are present in the cervical spine. Craniocervical junction is normal. Marrow signal is normal. Sinuses/Orbits: The paranasal sinuses and mastoid air cells are clear. Is some fluid in the  mastoid air cells, left greater than right. No obstructing nasopharyngeal lesion is present. Left globe replacement is noted. Cataract surgery is noted on the right. IMPRESSION: 1. Scattered areas of acute/subacute nonhemorrhagic infarction involving the left ACA distribution, ACA MCA watershed, and anterior genu of the corpus callosum on the left. 2. No other acute intracranial abnormality. 3. Atrophy and white matter disease is moderately advanced for age. This likely reflects the sequela of chronic microvascular ischemia. 4. Bilateral mastoid effusions, left greater than right. No obstructing nasopharyngeal lesion is present. The above was relayed via text pager to [facility] on 04/02/2019 at 16:11 . Electronically Signed   By: San Morelle M.D.   On: 04/02/2019 16:12   DG Chest Portable 1 View  Result Date: 04/02/2019 CLINICAL DATA:  Mediastinal adenopathy and multiple poorly defined pulmonary densities seen on CT scan of the neck  dated 04/02/2019. EXAM: PORTABLE CHEST 1 VIEW COMPARISON:  CT scan of the neck dated 04/02/2019 FINDINGS: The heart size and pulmonary vascularity are normal. There is slight fullness in mediastinum but this is much less obvious than on the CT scan where the mediastinal adenopathy is quite prominent. There are faint areas of subtle density in the lung apices and at the right midzone but these are not nearly as a apparent as on the prior CT scan. Slight atelectasis at the lung bases. No effusions. No bone abnormality. IMPRESSION: 1. The mediastinal adenopathy is not as apparent on chest x-ray as on the CT scan. Does the patient have a history of sarcoidosis? CT scan of the chest with contrast may help further delineate the extent of the abnormalities. 2. Subtle areas of density in the lung apices and at the right midzone. 3. Slight atelectasis at the lung bases. Electronically Signed   By: Francene Boyers M.D.   On: 04/02/2019 17:06   CT HEAD CODE STROKE WO  CONTRAST  Result Date: 04/02/2019 CLINICAL DATA:  Code stroke.  Right-sided weakness EXAM: CT HEAD WITHOUT CONTRAST TECHNIQUE: Contiguous axial images were obtained from the base of the skull through the vertex without intravenous contrast. COMPARISON:  None. FINDINGS: Brain: There is no acute intracranial hemorrhage, mass effect, or evidence of acute infarction. Gray-white differentiation is preserved. Ventricles and sulci are normal in size and configuration. Patchy hypoattenuation in the supratentorial white matter is nonspecific but may reflect minor chronic microvascular ischemic changes. There is no extra-axial collection. Vascular: No hyperdense vessel or unexpected calcification. Skull: Unremarkable. Sinuses/Orbits: Mild mucosal thickening.  Left ocular prosthesis. Other: Opacification of the left mastoid tip. ASPECTS [facility] Stroke Program Early CT Score) - Ganglionic level infarction (caudate, lentiform nuclei, internal capsule, insula, M1-M3 cortex): 7 - Supraganglionic infarction (M4-M6 cortex): 3 Total score (0-10 with 10 being normal): 10 IMPRESSION: 1. No acute intracranial hemorrhage or evidence of acute infarction. 2. ASPECTS is 10. 3. Minor chronic microvascular ischemic changes. These results were communicated to Dr. Amada Jupiter at 2:02 pmon 12/24/2020by text page via the [facility] messaging system. Electronically Signed   By: Guadlupe Spanish M.D.   On: 04/02/2019 14:04    Pending Labs Unresulted Labs (From admission, onward)    Start     Ordered   04/03/19 0500  Hemoglobin A1c  Tomorrow morning,   R     04/02/19 1740   04/03/19 0500  Lipid panel  Tomorrow morning,   R    Comments: Fasting    04/02/19 1740   04/03/19 0500  CBC  (heparin)  Daily,   R    Comments: Baseline for heparin therapy IF NOT ALREADY DRAWN.  Notify MD if PLT < 100 K.    04/02/19 1742   04/03/19 0500  bmp daily  Daily,   R     04/02/19 1742   04/02/19 1346  Ethanol  ONCE - STAT,   STAT     04/02/19 1345           Vitals/Pain Today's Vitals   04/02/19 2045 04/02/19 2100 04/02/19 2130 04/02/19 2230  BP:  113/87 (!) 111/92 115/88  Pulse: 96 90  84  Resp: (!) 22 15 (!) 27 20  Temp:      TempSrc:      SpO2: 95%   96%  Weight:      Height:      PainSc:        Isolation Precautions No active isolations  Medications  Medications  aspirin tablet 325 mg (325 mg Oral Given 04/02/19 1723)   stroke: mapping our early stages of recovery book (has no administration in time range)  acetaminophen (TYLENOL) tablet 650 mg (has no administration in time range)    Or  acetaminophen (TYLENOL) 160 MG/5ML solution 650 mg (has no administration in time range)    Or  acetaminophen (TYLENOL) suppository 650 mg (has no administration in time range)  senna-docusate (Senokot-S) tablet 1 tablet (has no administration in time range)  heparin injection 5,000 Units (5,000 Units Subcutaneous Not Given 04/02/19 2152)  atorvastatin (LIPITOR) tablet 80 mg (80 mg Oral Given 04/02/19 1832)  insulin aspart (novoLOG) injection 0-9 Units (0 Units Subcutaneous Not Given 04/02/19 1920)  insulin aspart (novoLOG) injection 0-5 Units (0 Units Subcutaneous Not Given 04/02/19 2146)  metoprolol tartrate (LOPRESSOR) tablet 12.5 mg (12.5 mg Oral Given 04/02/19 2148)  iohexol (OMNIPAQUE) 350 MG/ML injection 75 mL (75 mLs Intravenous Contrast Given 04/02/19 1412)  LORazepam (ATIVAN) injection 1 mg (1 mg Intravenous Given 04/02/19 1503)    Mobility walks Low fall risk   Focused Assessments Neuro Assessment Handoff:  Swallow screen pass? Yes  Cardiac Rhythm: Normal sinus rhythm NIH Stroke Scale ( + Modified Stroke Scale Criteria)  Interval: Initial Level of Consciousness (1a.)   : Alert, keenly responsive LOC Questions (1b. )   +: Answers both questions correctly LOC Commands (1c. )   + : Performs both tasks correctly Best Gaze (2. )  +: (prosthetic left eye and glaucoma) Visual (3. )  +: (prosthetic left eye and  glaucoma) Facial Palsy (4. )    : Normal symmetrical movements Motor Arm, Left (5a. )   +: No drift Motor Arm, Right (5b. )   +: No drift Motor Leg, Left (6a. )   +: No drift Motor Leg, Right (6b. )   +: No drift Limb Ataxia (7. ): Absent Sensory (8. )   +: Mild-to-moderate sensory loss, patient feels pinprick is less sharp or is dull on the affected side, or there is a loss of superficial pain with pinprick, but patient is aware of being touched Best Language (9. )   +: No aphasia Dysarthria (10. ): Normal Extinction/Inattention (11.)   +: No Abnormality Last date known well: 04/02/19 Last time known well: 0810 Neuro Assessment: Exceptions to WDL Neuro Checks:   Initial (04/02/19 1345)  Last Documented NIHSS Modified Score:   Has TPA been given? No If patient is a Neuro Trauma and patient is going to OR before floor call report to 4N Charge nurse: [phone] or [phone]     R Recommendations: See Admitting Provider Note  Report given to:   Additional Notes:

## 2019-04-02 NOTE — ED Triage Notes (Signed)
Pt arrived via [facility] from home. Pt woke up at 0800 this morning and felt fine, after she went and brushed her teeth she started to have right arm and right leg weakness. Pt has right arm numbness and tingling. When [facility] EMS was at scene and talked to pt, pt had 3 episodes of focal seizures. Pt had a blank stare and two of her fingers on left hand was twitching. Pt is A&Ox4. Pt is NSR on monitor. VS WNL

## 2019-04-02 NOTE — ED Notes (Signed)
Patient transported to MRI 

## 2019-04-02 NOTE — ED Notes (Signed)
Ordered diet tray 

## 2019-04-02 NOTE — H&P (Addendum)
Family Medicine Teaching [facility] Admission History and Physical Service Pager: [phone]  Patient name: Kathleen George Medical record number: [id number] Date of birth: [birth year] Age: 58 y.o. Gender: female  Primary Care Provider: Patient, No Pcp Per Consultants: Neurology  Code Status: Full Code  Preferred Emergency Contact: Daughter, Caryl Ada [phone]  Chief Complaint: Weakness  Assessment and Plan: Kathleen George is a 58 y.o. female presenting with neurological deficits assistant with confirmed cerebrovascular accident in ACA and MCA. PMH is significant for type 2 diabetes, hypertension, hypercholesterolemia, glaucoma and prosthetic left eye globe.  CVA in ACA and MCA  Code stroke called for patient's neurological deficits and patient underwent CT head and brain MRI which showed scattered areas of restricted diffusion throughout the ACA and MCA watershed areas.  Neurology determined TPA was not appropriate.  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  On physical exam patient noted to have tenderness to palpation of right lower extremity with strength 3 out of 5.  5 out of 5 strength in remainder of her extremities.  Neurological exam with apparent gross intact bilateral cranial nerves, patient follows commands, no apparent aphasia. Differential for cause of this CVA include coronary artery disease shown on angiograms in addition to medication noncompliance versus less likely intermittent A. fib given no history versus less likely septic emboli given lack of clinical symptoms.  -Admit to cardiac telemetry, attending Dr. Manson Passey -Neurology following, appreciate recommendations -Aspirin 325 mg daily -SLP eval, Patient passed swallow study -cardiac monitoring  -Echocardiogram, transthoracic -follow up Hemoglobin A1c, lipid profile -HIV - daily BMP  -Tylenol   -Atorvastatin  daily  -up with assistance -fall precautions  -continuous pulse oximetry  -vitals every 2 hours, permissive hypertension -PT/OT  Type 2 diabetes Patient reports history of type 2 diabetes and reports taking insulin twice daily at home.  She also denies being on Metformin previously.  -Measure hemoglobin A1c -Sensitive sliding scale insulin  Hypertension Allow permissive hypertension given acute stroke.  Blood pressure in ED 123/92.  Home medications include metoprolol 25 mg twice daily, lisinopril 10 and amlodipine 5.  Patient states that she last had her medications yesterday.  We will hold amlodipine and lisinopril.  We will continue lower dose metoprolol as to avoid rebound tachycardia. -Permissive hypertension given acute stroke -Monitor with vital signs -Continue home metoprolol at reduced dose of 12.5 mg twice daily  History of glaucoma  Patient reports history of abnormal ophthalmology exam in which she was diagnosed with glaucoma and complications of diabetes.  She reports undergoing eye surgery in which a prosthetic globe was placed in her left orbit.  Patient regularly sees an ophthalmologist every 2 to 3 months.  Neuro exam and right orbit is within normal limits with extraocular movements intact, no scleral icterus, pupil that is equal, round and reactive to light.  Patient denies any blurry vision in right eye. -No inpatient intervention  Hyper cholesterolemia Patient reports taking a medication for cholesterol but does not remember the name.  Patient also does not have this medication on her person during this admission. -Lipid panel -Start atorvastatin 80 per neurology  Abnormal thoracic findings on CT angio of the neck Patient noted to have partially imaged groundglass and nodular opacities and conglomerate mediastinal adenopathy.  Patient reports history of sarcoidosis and reports history of pulmonary biopsy that showed no malignancy. -Recommend  chest CT as outpatient, patient does not remember details of  past work-up, may be useful to bring daughter into conversation  FEN/GI: Carb modified Prophylaxis: Heparin 5000 TID  Disposition: Admit to cardiac telemetry  History of Present Illness:  Kathleen George is a 58 y.o. female presenting with weakness that began in her right lower extremity that eventually progressed to all of her extremities.  Patient states that she was home doing her usual activities around the house when she started to feel that her legs were weak, right leg more so than the left.  Patient dates that she felt she was going to fall so she decided to have a snack and rest.  Patient reports that she felt weak in all her extremities and fell to the floor and was unable to move.  Her daughter and daughter's boyfriend were home and helped her to the restroom and she began to feel the of returned to her extremities.  Since arriving to the hospital her symptoms have further improved and patient is able to move all extremities but does report a difference in sensation on her right lower extremity versus her left.  Patient reports that she has a primary doctor in [facility] but is not able to recall the name.  She denies hx of heart attack, no smoking, does chew tobacco occasionally at night, social drinking, no illicit drugs.    Surgical hx of hysterectomy ~36yrs ago, knee surgery (arthroscopy?), lung biopsy (test for sarcoid), "spots on pancreas", lumps in breast (one removed, no cancer per patient), diabetes >87yrs, surgery on left eye from glaucoma/cataracts and pain.   On review of systems, patient reports no headache, no changes in vision from baseline, feels she is back to normal with exception of right leg feels different in regards to sensation and tenderness to touch.   No stomach pain, just hungry, no SOB. No sore throat, slight chronic left ear hearing deficit unchanged.    Patient denies  paresthesias.  Review Of Systems: Per HPI with the following additions:   There are no problems to display for this patient.   Past Medical History: Past Medical History:  Diagnosis Date  . Diabetes mellitus without complication (HCC)   . Glaucoma   . Hypercholesterolemia   . Hypertension   . Prosthetic eye globe   . Stroke [facility])     Past Surgical History: Past Surgical History:  Procedure Laterality Date  . BREAST BIOPSY    . left knee surg      Social History: Social History   Tobacco Use  . Smoking status: Former Games developer  . Smokeless tobacco: Never Used  Substance Use Topics  . Alcohol use: Not Currently  . Drug use: Never   Additional social history: Uses tobacco due to Please also refer to relevant sections of EMR.  Family History: Family History  Problem Relation Age of Onset  . Hypertension Mother   . Hypertension Father     Allergies and Medications: Allergies  Allergen Reactions  . Penicillins Anaphylaxis    Did it involve swelling of the face/tongue/throat, SOB, or low BP? Yes Did it involve sudden or severe rash/hives, skin peeling, or any reaction on the inside of your mouth or nose? N/A Did you need to seek medical attention at a hospital or doctor's office? N/A When did it last happen?Child If all above answers are "NO", may proceed with cephalosporin use.   No current facility-administered medications on file prior to encounter.   Current Outpatient Medications on File Prior to Encounter  Medication Sig Dispense Refill  .  amLODipine (NORVASC) 5 MG tablet Take 5 mg by mouth daily.    . dorzolamide-timolol (COSOPT) 22.3-6.8 MG/ML ophthalmic solution Place 1 drop into the right eye 2 (two) times daily.    Marland Kitchen erythromycin ophthalmic ointment Place 1 application into the left eye at bedtime.    . liraglutide (VICTOZA) 18 MG/3ML SOPN Inject 1.2 mg into the skin every evening.    Marland Kitchen lisinopril (ZESTRIL) 10 MG tablet Take 10 mg by mouth daily.     . metoprolol tartrate (LOPRESSOR) 25 MG tablet Take 25 mg by mouth 2 (two) times daily.      Objective: BP (!) 123/92   Pulse 86   Temp 98.6 F (37 C) (Oral)   Resp 12   Ht 5\' 2"  (1.575 m)   Wt 59 kg   SpO2 94%   BMI 23.78 kg/m   Exam: General: Underweight, female appearing in no acute distress, sitting up in bed Eyes: Left prosthetic globe, extraocular muscles intact, right pupil reactive to light and accommodation ENTM: Moist mucous membranes, no oropharyngeal erythema or petechiae Cardiovascular: Regular rate and rhythm without murmurs, normal S1-S2, bilateral radial pulses palpable Respiratory: Clear to auscultation bilaterally without wheezing, no increased work of breathing, no crackles appreciated Gastrointestinal: Soft, nontender, minimal bowel sounds appreciated no masses palpated MSK: Able to move all extremities, tenderness to palpation of right lower extremity Derm: No lesions noted on exam, and is warm and dry Neuro: Right pupil reactive to light and accommodation, extraocular muscles intact, strength in the lateral upper extremities 5/5, strength in right lower extremity 3/5, right lower extremity strength 5 out of 5, increase sensation in right lower extremity with tenderness to gentle palpation, sensation normal in left lower and bilateral upper extremities, and is alert and oriented and follows commands, normal grip strength, cranial nerves II through XII grossly intact bilaterally Psych: Patient with odd affect at times, but with linear thought content and normal speech, cooperative with exam  Labs and Imaging: CBC BMET  Recent Labs  Lab 04/02/19 1410 04/02/19 1422  WBC 10.0  --   HGB 13.9 14.6  HCT 42.0 43.0  PLT 353  --    Recent Labs  Lab 04/02/19 1410 04/02/19 1422  NA 139 139  K 4.1 3.9  CL 102 102  CO2 25  --   BUN 15 18  CREATININE 0.88 0.70  GLUCOSE 172* 168*  CALCIUM 9.3  --      EKG: Normal sinus rhythm with no ST changes  CT ANGIO  HEAD W OR WO CONTRAST  Result Date: 04/02/2019 CLINICAL DATA:  Right-sided weakness EXAM: CT ANGIOGRAPHY HEAD AND NECK TECHNIQUE: Multidetector CT imaging of the head and neck was performed using the standard protocol during bolus administration of intravenous contrast. Multiplanar CT image reconstructions and MIPs were obtained to evaluate the vascular anatomy. Carotid stenosis measurements (when applicable) are obtained utilizing NASCET criteria, using the distal internal carotid diameter as the denominator. CONTRAST:  22mL OMNIPAQUE IOHEXOL 350 MG/ML SOLN COMPARISON:  None. FINDINGS: CTA NECK FINDINGS Aortic arch: Great vessel origins are patent. Right carotid system: Common, internal, and external carotid arteries are patent. There is no measurable stenosis or evidence of dissection. Left carotid system: Common, internal, and external carotid arteries are patent. There is no measurable stenosis or evidence of dissection. Vertebral arteries: Left vertebral artery is patent. There is no visible enhancement of the right vertebral artery until the V3 segment. Skeleton: Multilevel degenerative changes of the cervical spine. Other neck: No neck  mass or adenopathy. Upper chest: There is mild interstitial thickening, patchy ground-glass opacities and scattered nodular opacities. There is conglomerate mediastinal adenopathy. Review of the MIP images confirms the above findings CTA HEAD FINDINGS Anterior circulation: Intracranial internal carotid arteries are patent with minor calcified plaque. Middle and anterior cerebral arteries are patent. Posterior circulation: Intracranial vertebral arteries are patent. Basilar artery is patent. Posterior cerebral arteries are patent. Venous sinuses: As permitted by contrast timing, patent. 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 There is reconstitution at the level of the V3 segment.  Otherwise, no large vessel occlusion or hemodynamically significant stenosis. Partially imaged abnormal thoracic findings including pulmonary ground-glass and nodular opacities and conglomerate mediastinal adenopathy. Dedicated chest imaging is recommended. These results were communicated to Dr. Amada JupiterKirkpatrick at 2:25 pmon 12/24/2020by text page via the [facility] messaging system. Electronically Signed   By: Guadlupe SpanishPraneil  Patel M.D.   On: 04/02/2019 14:30   CT ANGIO NECK W OR WO CONTRAST  Result Date: 04/02/2019 CLINICAL DATA:  Right-sided weakness EXAM: CT ANGIOGRAPHY HEAD AND NECK TECHNIQUE: Multidetector CT imaging of the head and neck was performed using the standard protocol during bolus administration of intravenous contrast. Multiplanar CT image reconstructions and MIPs were obtained to evaluate the vascular anatomy. Carotid stenosis measurements (when applicable) are obtained utilizing NASCET criteria, using the distal internal carotid diameter as the denominator. CONTRAST:  75mL OMNIPAQUE IOHEXOL 350 MG/ML SOLN COMPARISON:  None. FINDINGS: CTA NECK FINDINGS Aortic arch: Great vessel origins are patent. Right carotid system: Common, internal, and external carotid arteries are patent. There is no measurable stenosis or evidence of dissection. Left carotid system: Common, internal, and external carotid arteries are patent. There is no measurable stenosis or evidence of dissection. Vertebral arteries: Left vertebral artery is patent. There is no visible enhancement of the right vertebral artery until the V3 segment. Skeleton: Multilevel degenerative changes of the cervical spine. Other neck: No neck mass or adenopathy. Upper chest: There is mild interstitial thickening, patchy ground-glass opacities and scattered nodular opacities. There is conglomerate mediastinal adenopathy. Review of the MIP images confirms the above findings CTA HEAD FINDINGS Anterior circulation: Intracranial internal carotid arteries are patent  with minor calcified plaque. Middle and anterior cerebral arteries are patent. Posterior circulation: Intracranial vertebral arteries are patent. Basilar artery is patent. Posterior cerebral arteries are patent. Venous sinuses: As permitted by contrast timing, patent. 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 There is reconstitution at the level of the V3 segment. Otherwise, no large vessel occlusion or hemodynamically significant stenosis. Partially imaged abnormal thoracic findings including pulmonary ground-glass and nodular opacities and conglomerate mediastinal adenopathy. Dedicated chest imaging is recommended. These results were communicated to Dr. Amada JupiterKirkpatrick at 2:25 pmon 12/24/2020by text page via the [facility] messaging system. Electronically Signed   By: Guadlupe SpanishPraneil  Patel M.D.   On: 04/02/2019 14:30   MR BRAIN WO CONTRAST  Result Date: 04/02/2019 CLINICAL DATA:  Acute onset of right lower extremity weakness beginning 7.5 hours ago. EXAM: MRI HEAD WITHOUT CONTRAST TECHNIQUE: Multiplanar, multiecho pulse sequences of the brain and surrounding structures were obtained without intravenous contrast. COMPARISON:  CTA head and neck 03/03/2019 FINDINGS: Brain: Scattered areas of restricted diffusion are present throughout the left ACA distribution. Many of these are along the ACA/ MCA watershed. There is restricted diffusion involving the anterior genu of the corpus callosum on the left. Single right-sided  lesion is present anteriorly in the medial right frontal lobe measuring 6.5 mm. The T2 hyperintense lesion in the left corona radiata does not show restricted diffusion. There is T2 shine through. T2 signal changes are associated with the areas of acute infarction. Periventricular T2 hyperintensities are present separately as well, moderately advanced for age. Vascular: Flow is present in the major intracranial arteries.  Skull and upper cervical spine: Degenerative changes are present in the cervical spine. Craniocervical junction is normal. Marrow signal is normal. Sinuses/Orbits: The paranasal sinuses and mastoid air cells are clear. Is some fluid in the mastoid air cells, left greater than right. No obstructing nasopharyngeal lesion is present. Left globe replacement is noted. Cataract surgery is noted on the right. IMPRESSION: 1. Scattered areas of acute/subacute nonhemorrhagic infarction involving the left ACA distribution, ACA MCA watershed, and anterior genu of the corpus callosum on the left. 2. No other acute intracranial abnormality. 3. Atrophy and white matter disease is moderately advanced for age. This likely reflects the sequela of chronic microvascular ischemia. 4. Bilateral mastoid effusions, left greater than right. No obstructing nasopharyngeal lesion is present. The above was relayed via text pager to [facility] on 04/02/2019 at 16:11 . Electronically Signed   By: Marin Roberts M.D.   On: 04/02/2019 16:12   DG Chest Portable 1 View  Result Date: 04/02/2019 CLINICAL DATA:  Mediastinal adenopathy and multiple poorly defined pulmonary densities seen on CT scan of the neck dated 04/02/2019. EXAM: PORTABLE CHEST 1 VIEW COMPARISON:  CT scan of the neck dated 04/02/2019 FINDINGS: The heart size and pulmonary vascularity are normal. There is slight fullness in mediastinum but this is much less obvious than on the CT scan where the mediastinal adenopathy is quite prominent. There are faint areas of subtle density in the lung apices and at the right midzone but these are not nearly as a apparent as on the prior CT scan. Slight atelectasis at the lung bases. No effusions. No bone abnormality. IMPRESSION: 1. The mediastinal adenopathy is not as apparent on chest x-ray as on the CT scan. Does the patient have a history of sarcoidosis? CT scan of the chest with contrast may help further delineate the extent  of the abnormalities. 2. Subtle areas of density in the lung apices and at the right midzone. 3. Slight atelectasis at the lung bases. Electronically Signed   By: Francene Boyers M.D.   On: 04/02/2019 17:06   CT HEAD CODE STROKE WO CONTRAST  Result Date: 04/02/2019 CLINICAL DATA:  Code stroke.  Right-sided weakness EXAM: CT HEAD WITHOUT CONTRAST TECHNIQUE: Contiguous axial images were obtained from the base of the skull through the vertex without intravenous contrast. COMPARISON:  None. FINDINGS: Brain: There is no acute intracranial hemorrhage, mass effect, or evidence of acute infarction. Gray-white differentiation is preserved. Ventricles and sulci are normal in size and configuration. Patchy hypoattenuation in the supratentorial white matter is nonspecific but may reflect minor chronic microvascular ischemic changes. There is no extra-axial collection. Vascular: No hyperdense vessel or unexpected calcification. Skull: Unremarkable. Sinuses/Orbits: Mild mucosal thickening.  Left ocular prosthesis. Other: Opacification of the left mastoid tip. ASPECTS [facility] Stroke Program Early CT Score) - Ganglionic level infarction (caudate, lentiform nuclei, internal capsule, insula, M1-M3 cortex): 7 - Supraganglionic infarction (M4-M6 cortex): 3 Total score (0-10 with 10 being normal): 10 IMPRESSION: 1. No acute intracranial hemorrhage or evidence of acute infarction. 2. ASPECTS is 10. 3. Minor chronic microvascular ischemic changes. These results were communicated to Dr. Amada Jupiter at  2:02 pmon 12/24/2020by text page via the [facility] messaging system. Electronically Signed   By: Guadlupe Spanish M.D.   On: 04/02/2019 14:04   Nicki Guadalajara, MD 04/02/2019, 6:18 PM PGY-1, [facility] Family Medicine FPTS Intern pager: [phone], text pages welcome  FPTS Upper-Level Resident Addendum   I have independently interviewed and examined the patient. I have discussed the above with the original author and agree with their  documentation. My edits for correction/addition/clarification are in blue. Please see also any attending notes.    Marthenia Rolling, DO PGY-3, [place] [facility] 04/02/2019 6:42 PM  FPTS Service pager: [phone] (text pages welcome through [facility])

## 2019-04-03 ENCOUNTER — Other Ambulatory Visit (HOSPITAL_COMMUNITY): Payer: Medicare Other

## 2019-04-03 ENCOUNTER — Inpatient Hospital Stay (HOSPITAL_COMMUNITY): Payer: Medicare Other

## 2019-04-03 DIAGNOSIS — I361 Nonrheumatic tricuspid (valve) insufficiency: Secondary | ICD-10-CM

## 2019-04-03 DIAGNOSIS — E1159 Type 2 diabetes mellitus with other circulatory complications: Secondary | ICD-10-CM

## 2019-04-03 DIAGNOSIS — D869 Sarcoidosis, unspecified: Secondary | ICD-10-CM

## 2019-04-03 LAB — LIPID PANEL
Cholesterol: 178 mg/dL (ref 0–200)
HDL: 25 mg/dL — ABNORMAL LOW (ref >40)
LDL Cholesterol: 76 mg/dL (ref 0–99)
Total CHOL/HDL Ratio: 7.1 RATIO
Triglycerides: 384 mg/dL — ABNORMAL HIGH (ref <150)
VLDL: 77 mg/dL — ABNORMAL HIGH (ref 0–40)

## 2019-04-03 LAB — GLUCOSE, CAPILLARY
Glucose-Capillary: 139 mg/dL — ABNORMAL HIGH (ref 70–99)
Glucose-Capillary: 150 mg/dL — ABNORMAL HIGH (ref 70–99)
Glucose-Capillary: 295 mg/dL — ABNORMAL HIGH (ref 70–99)

## 2019-04-03 LAB — CBC
HCT: 40.4 % (ref 36.0–46.0)
Hemoglobin: 13.7 g/dL (ref 12.0–15.0)
MCH: 30 pg (ref 26.0–34.0)
MCHC: 33.9 g/dL (ref 30.0–36.0)
MCV: 88.4 fL (ref 80.0–100.0)
Platelets: 382 10*3/uL (ref 150–400)
RBC: 4.57 MIL/uL (ref 3.87–5.11)
RDW: 12.8 % (ref 11.5–15.5)
WBC: 9.2 10*3/uL (ref 4.0–10.5)
nRBC: 0 % (ref 0.0–0.2)

## 2019-04-03 LAB — BASIC METABOLIC PANEL
Anion gap: 12 (ref 5–15)
BUN: 17 mg/dL (ref 6–20)
CO2: 26 mmol/L (ref 22–32)
Calcium: 9 mg/dL (ref 8.9–10.3)
Chloride: 101 mmol/L (ref 98–111)
Creatinine, Ser: 0.7 mg/dL (ref 0.44–1.00)
GFR calc Af Amer: >60 mL/min (ref >60)
GFR calc non Af Amer: >60 mL/min (ref >60)
Glucose, Bld: 165 mg/dL — ABNORMAL HIGH (ref 70–99)
Potassium: 3.8 mmol/L (ref 3.5–5.1)
Sodium: 139 mmol/L (ref 135–145)

## 2019-04-03 LAB — SEDIMENTATION RATE: Sed Rate: 35 mm/hr — ABNORMAL HIGH (ref 0–22)

## 2019-04-03 LAB — HEMOGLOBIN A1C
Hgb A1c MFr Bld: 7.2 % — ABNORMAL HIGH (ref 4.8–5.6)
Mean Plasma Glucose: 159.94 mg/dL

## 2019-04-03 LAB — ECHOCARDIOGRAM COMPLETE
Height: 62 in
Weight: 2080 oz

## 2019-04-03 MED ORDER — ASPIRIN EC 81 MG PO TBEC: 81.0000 mg | DELAYED_RELEASE_TABLET | Freq: Every day | ORAL | Status: DC

## 2019-04-03 MED ORDER — ASPIRIN 81 MG PO TBEC: 81.0000 mg | DELAYED_RELEASE_TABLET | Freq: Every day | ORAL | 0 refills | Status: AC

## 2019-04-03 MED ORDER — CLOPIDOGREL BISULFATE 75 MG PO TABS
75.0000 mg | ORAL_TABLET | Freq: Every day | ORAL | Status: DC
  Administered 2019-04-03: 75 mg via ORAL
  Filled 2019-04-03: qty 1

## 2019-04-03 MED ORDER — CLOPIDOGREL BISULFATE 75 MG PO TABS: 75.0000 mg | ORAL_TABLET | Freq: Every day | ORAL | 0 refills | Status: AC

## 2019-04-03 MED ORDER — ATORVASTATIN CALCIUM 80 MG PO TABS: 80.0000 mg | ORAL_TABLET | Freq: Every day | ORAL | 0 refills | Status: AC

## 2019-04-03 NOTE — Evaluation (Addendum)
Physical Therapy Evaluation Patient Details Name: Kathleen George MRN: [id number] DOB: 29-Dec-1960 Today's Date: 04/03/2019   History of Present Illness  Pt is a 58 y.o. female admitted 04/02/19 with RLE weakness. 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 PMH includes DM2, HTN, glaucoma, prosthetic L eye.    Clinical Impression  Pt presents with an overall decrease in functional mobility secondary to above. PTA, pt independent and lives alone; pt visiting family in the area when stroke occurred leading to admission. Today, pt requiring intermittent minA to stand and maintain balance with ambulation; stability much improved with use of RW although some R knee instability persists. Pt reports family able to provide 24/7 support as needed. If pt to remain admitted, would likely to follow up for additional PT session tomorrow for continued gait and stair training.    Pt c/o some lightheadedness while walking, BP 138/99    Follow Up Recommendations No PT follow up;Supervision for mobility/OOB    Equipment Recommendations  Rolling walker with 5" wheels    Recommendations for Other Services       Precautions / Restrictions Precautions Precautions: Fall Restrictions Weight Bearing Restrictions: No      Mobility  Bed Mobility Overal bed mobility: Independent                Transfers Overall transfer level: Needs assistance Equipment used: 1 person hand held assist;Rolling walker (2 wheeled) Transfers: Sit to/from Stand Sit to Stand: Min guard;Min assist         General transfer comment: Initial minA to maintain balance with HHA standing from EOB, minA to elevate trunk from low toilet height. Additional trial from bed to RW with min guard  Ambulation/Gait Ambulation/Gait assistance: Min assist;Min guard Gait Distance (Feet): 30 Feet Assistive device: 1 person hand held assist;Rolling walker (2 wheeled) Gait Pattern/deviations:  Step-through pattern;Decreased stride length Gait velocity: Decreased   General Gait Details: Initial ambulation without DME, pt reliant on HHA and minA to maintain balance with R knee buckling noted. Stability much improved with use of RW, intermittent min guard for balance  Stairs            Wheelchair Mobility    Modified Rankin (Stroke Patients Only) Modified Rankin (Stroke Patients Only) Pre-Morbid Rankin Score: No symptoms Modified Rankin: Moderately severe disability     Balance Overall balance assessment: Needs assistance   Sitting balance-Leahy Scale: Fair       Standing balance-Leahy Scale: Poor Standing balance comment: Reliant on UE support                             Pertinent Vitals/Pain Pain Assessment: No/denies pain    Home Living Family/patient expects to be discharged to:: Private residence Living Arrangements: Alone Available Help at Discharge: Family;Available 24 hours/day Type of Home: House Home Access: Stairs to enter Entrance Stairs-Rails: Right Entrance Stairs-Number of Steps: 5 Home Layout: One level Home Equipment: None Additional Comments: Pt lives alone in [place], but currently visiting family in the area for Christmas with 24/7 assist available. Home set-up for daughter's home    Prior Function Level of Independence: Independent         Comments: Independent with mobility and ADLs. Does not drive; children live nearby and assist with transportation/errands     Hand Dominance        Extremity/Trunk Assessment   Upper Extremity Assessment Upper Extremity Assessment: Overall [facility]  for tasks assessed(dysmetria noted RUE>LUE (could potentially be related to L prosthetic eye))    Lower Extremity Assessment Lower Extremity Assessment: Overall WFL for tasks assessed       Communication   Communication: No difficulties  Cognition Arousal/Alertness: Awake/alert Behavior During Therapy: WFL for tasks  assessed/performed Overall Cognitive Status: Within Functional Limits for tasks assessed                                 General Comments: WFL for simple tasks, not formally assessed; some decreased attention noted likely baseline      General Comments General comments (skin integrity, edema, etc.): Educ on stroke signs/symptoms    Exercises     Assessment/Plan    PT Assessment Patient needs continued PT services  PT Problem List Decreased balance;Decreased mobility;Decreased knowledge of use of DME       PT Treatment Interventions DME instruction;Gait training;Stair training;Functional mobility training;Therapeutic activities;Therapeutic exercise;Balance training    PT Goals (Current goals can be found in the Care Plan section)  Acute Rehab PT Goals Patient Stated Goal: Return to daughter's house to spend holidays with family PT Goal Formulation: With patient Time For Goal Achievement: 04/17/19 Potential to Achieve Goals: Good    Frequency Min 4X/week   Barriers to discharge        Co-evaluation               AM-PAC PT "6 Clicks" Mobility  Outcome Measure Help needed turning from your back to your side while in a flat bed without using bedrails?: None Help needed moving from lying on your back to sitting on the side of a flat bed without using bedrails?: None Help needed moving to and from a bed to a chair (including a wheelchair)?: A Little Help needed standing up from a chair using your arms (e.g., wheelchair or bedside chair)?: A Little Help needed to walk in hospital room?: A Little Help needed climbing 3-5 steps with a railing? : A Little 6 Click Score: 20    End of Session   Activity Tolerance: Patient tolerated treatment well Patient left: in bed;with call bell/phone within reach;with bed alarm set Nurse Communication: Mobility status PT Visit Diagnosis: Other abnormalities of gait and mobility (R26.89);Unsteadiness on feet (R26.81)     Time: 1610-9604 PT Time Calculation (min) (ACUTE ONLY): 21 min   Charges:   PT Evaluation $PT Eval Moderate Complexity: [place], PT, DPT Acute Rehabilitation Services  Pager [phone] Office [place] 04/03/2019, 3:12 PM

## 2019-04-03 NOTE — Progress Notes (Signed)
NURSING PROGRESS NOTE  Kathleen George [id number] Discharge Data: 04/03/2019 6:52 PM Attending Provider: Martyn Malay, MD [id number], No Pcp Per     Kathleen George discharged home per MD order.  Discussed with the patient the After Visit Summary and all questions fully answered. All IV's discontinued with no bleeding noted. All belongings returned to patient for patient to take home.   Last Vital Signs:  Blood pressure 110/85, pulse (!) 104, temperature 98.3 F (36.8 C), temperature source Oral, resp. rate 17, height 5\' 2"  (1.575 m), weight 59 kg, SpO2 99 %.  Discharge Medication List Allergies as of 04/03/2019      Reactions   Penicillins Anaphylaxis   Did it involve swelling of the face/tongue/throat, SOB, or low BP? Yes Did it involve sudden or severe rash/hives, skin peeling, or any reaction on the inside of your mouth or nose? N/A Did you need to seek medical attention at a hospital or doctor's office? N/A When did it last happen?Child If all above answers are "NO", may proceed with cephalosporin use.      Medication List    STOP taking these medications   amLODipine 5 MG tablet Commonly known as: NORVASC   lisinopril 10 MG tablet Commonly known as: ZESTRIL   metoprolol tartrate 25 MG tablet Commonly known as: LOPRESSOR     TAKE these medications   aspirin 81 MG EC tablet Take 1 tablet (81 mg total) by mouth daily. Start taking on: April 04, 2019   atorvastatin 80 MG tablet Commonly known as: LIPITOR Take 1 tablet (80 mg total) by mouth daily at 6 PM.   clopidogrel 75 MG tablet Commonly known as: PLAVIX Take 1 tablet (75 mg total) by mouth daily for 21 days. Start taking on: April 04, 2019   dorzolamide-timolol 22.3-6.8 MG/ML ophthalmic solution Commonly known as: COSOPT Place 1 drop into the right eye 2 (two) times daily.   erythromycin ophthalmic ointment Place 1 application into the left eye at bedtime.   liraglutide 18 MG/3ML Sopn Commonly  known as: VICTOZA Inject 1.2 mg into the skin every evening.            Durable Medical Equipment  (From admission, onward)         Start     Ordered   04/03/19 1530  For home use only DME Walker rolling  Once    Question Answer Comment  Patient needs a walker to treat with the following condition Weakness   Patient needs a walker to treat with the following condition CVA (cerebral vascular accident) ([place])      04/03/19 1529

## 2019-04-03 NOTE — Discharge Summary (Signed)
[facility] Discharge Summary  Patient name: Kathleen George Medical record number: [id number] Date of birth: 03-02-1961 Age: 58 y.o. Gender: female Date of Admission: 04/02/2019  Date of Discharge: 04/03/2019 Admitting Physician: Martyn Malay, MD  Primary Care Provider: Patient, No Pcp Per Consultants: Neurology  Indication for Hospitalization: Weakness, Acute CVA  Discharge Diagnoses/Problem List:  Acute CVA and ACA and MCA Type 2 diabetes Hypertension Glaucoma Hyperlipidemia Abnormal thoracic findings on CT angio of neck  Disposition: Home  Discharge Condition: Stable, improved  Discharge Exam:  Physical Exam:  General: 58 y.o. female in NAD HEENT: Left glass eye, right pupil reactive Cardio: RRR no m/r/g Lungs: CTAB, no wheezing, no rhonchi, no crackles, no IWOB on RA Skin: warm and dry Extremities: No edema Neuro: CN II through XII grossly intact, 5/5 strength BUE/BLE, sensation intact throughout  Brief Hospital Course:  Kathleen George a 58 y.o.femalewho presented with neurological deficits assistant with confirmed cerebrovascular accident in [place] and MCA. PMH is significant fortype 2 diabetes, hypertension, hypercholesterolemia, glaucoma and prosthetic left eye globe.  Her hospital course is outlined below.  CVA in [place] and MCA Patient presented with right lower extremity weakness, admission details can be found in H&P.  Found to have new stroke on MRI with scattered areas of restricted diffusion throughout [place] and MCA watershed areas.  Neurology was consulted and recommended admission for further stroke work-up.  She received an echo that showed normal EF with grade 1 diastolic dysfunction, no evidence of thrombus noted.  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  The day after admission, patient found to have improved overall status, right lower  extremity strength improved to 5/5.  She was neurologically intact on exam.  She was started on atorvastatin 80 mg daily, aspirin 325 mg daily.  Neurology recommended patient be on dual antiplatelet therapy for 3 weeks, then discontinue Plavix and only continue aspirin.  Given this, she was continued on aspirin 81 mg daily and Plavix.  She will need a TEE as well as loop recorder on discharge.  She was referred to cardiology on discharge.  Also need transcranial Doppler bubble study for PFO, antiphospholipid antibodies, lupus anticoagulation, ANA panel, ACE levels and ESR.  These were ordered by neurology.  Per neurology, is okay for patient to leave today as she wanted to do this, but could have this as an outpatient.  Physical therapy saw patient and recommended no PT follow-up, did recommend rolling walker, this was ordered.  At the time of discharge, patient was at her neurologic baseline, vital signs are stable, she was without complaint.  Hypertension Patient's metoprolol, lisinopril, amlodipine were held while inpatient for permissive hypertension.  These were also had on discharge, can normalize slowly in 5 to 7 days.  Abnormal thoracic findings on CT angio of the neck Patient noted to have partially imaged groundglass and nodular opacities and conglomerate mediastinal adenopathy. Patient reports history of sarcoidosis and reports history of pulmonary biopsy that showed no malignancy.  Recommend CT chest as outpatient as patient does not remember details of past work-up.   Issues for Follow Up:  1. Recommend repeat lipid panel, triglycerides were elevated, not sure patient was fasting at that time.  She was discharged on atorvastatin 80 mg daily. 2. A1c 7.2 on admission, goal will be less than 7 for this patient.  Consider adjusting diabetes regimen to achieve this goal. 3. Patient has an LDL  goal of less than 70, with 76 on admission. 4. Ensure outpatient cardiology follow-up, see above for  things that patient needs. 5. Recommend neurology follow-up, patient given Dr. Clydene Fake number to call if she does not receive a phone call from them in the next few days from discharge. 6. Recommend repeat CT chest given findings on CT angio of neck.  Significant Procedures: None  Significant Labs and Imaging:  Recent Labs  Lab 04/02/19 1410 04/02/19 1422 04/03/19 0300  WBC 10.0  --  9.2  HGB 13.9 14.6 13.7  HCT 42.0 43.0 40.4  PLT 353  --  382   Recent Labs  Lab 04/02/19 1410 04/02/19 1422 04/03/19 0300  NA 139 139 139  K 4.1 3.9 3.8  CL 102 102 101  CO2 25  --  26  GLUCOSE 172* 168* 165*  BUN _0 CREATININE 0.88 0.70 0.70  CALCIUM 9.3  --  9.0  ALKPHOS 103  --   --   AST 22  --   --   ALT 32  --   --   ALBUMIN 3.5  --   --     Lipid Panel     Component Value Date/Time   CHOL 178 04/03/2019 0300   TRIG 384 (H) 04/03/2019 0300   HDL 25 (L) 04/03/2019 0300   CHOLHDL 7.1 04/03/2019 0300   VLDL 77 (H) 04/03/2019 0300   LDLCALC 76 04/03/2019 0300    CT ANGIO HEAD W OR WO CONTRAST  Result Date: 04/02/2019 CLINICAL DATA:  Right-sided weakness EXAM: CT ANGIOGRAPHY HEAD AND NECK TECHNIQUE: Multidetector CT imaging of the head and neck was performed using the standard protocol during bolus administration of intravenous contrast. Multiplanar CT image reconstructions and MIPs were obtained to evaluate the vascular anatomy. Carotid stenosis measurements (when applicable) are obtained utilizing NASCET criteria, using the distal internal carotid diameter as the denominator. CONTRAST:  81m OMNIPAQUE IOHEXOL 350 MG/ML SOLN COMPARISON:  None. FINDINGS: CTA NECK FINDINGS Aortic arch: Great vessel origins are patent. Right carotid system: Common, internal, and external carotid arteries are patent. There is no measurable stenosis or evidence of dissection. Left carotid system: Common, internal, and external carotid arteries are patent. There is no measurable stenosis or evidence  of dissection. Vertebral arteries: Left vertebral artery is patent. There is no visible enhancement of the right vertebral artery until the V3 segment. Skeleton: Multilevel degenerative changes of the cervical spine. Other neck: No neck mass or adenopathy. Upper chest: There is mild interstitial thickening, patchy ground-glass opacities and scattered nodular opacities. There is conglomerate mediastinal adenopathy. Review of the MIP images confirms the above findings CTA HEAD FINDINGS Anterior circulation: Intracranial internal carotid arteries are patent with minor calcified plaque. Middle and anterior cerebral arteries are patent. Posterior circulation: Intracranial vertebral arteries are patent. Basilar artery is patent. Posterior cerebral arteries are patent. Venous sinuses: As permitted by contrast timing, patent. 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 There is reconstitution at the level of the V3 segment. Otherwise, no large vessel occlusion or hemodynamically significant stenosis. Partially imaged abnormal thoracic findings including pulmonary ground-glass and nodular opacities and conglomerate mediastinal adenopathy. Dedicated chest imaging is recommended. These results were communicated to Dr. KLeonel Ramsayat 2:25 pmon 12/24/2020by text page via the [facility] system. Electronically Signed   By: PMacy MisM.D.   On: 04/02/2019 14:30   CT ANGIO NECK W OR WO CONTRAST  Result Date: 04/02/2019 CLINICAL DATA:  Right-sided weakness EXAM: CT ANGIOGRAPHY HEAD AND NECK TECHNIQUE: Multidetector CT imaging of the head and neck was performed using the standard protocol during bolus administration of intravenous contrast. Multiplanar CT image reconstructions and MIPs were obtained to evaluate the vascular anatomy. Carotid stenosis measurements (when applicable) are obtained utilizing NASCET criteria, using the  distal internal carotid diameter as the denominator. CONTRAST:  44m OMNIPAQUE IOHEXOL 350 MG/ML SOLN COMPARISON:  None. FINDINGS: CTA NECK FINDINGS Aortic arch: Great vessel origins are patent. Right carotid system: Common, internal, and external carotid arteries are patent. There is no measurable stenosis or evidence of dissection. Left carotid system: Common, internal, and external carotid arteries are patent. There is no measurable stenosis or evidence of dissection. Vertebral arteries: Left vertebral artery is patent. There is no visible enhancement of the right vertebral artery until the V3 segment. Skeleton: Multilevel degenerative changes of the cervical spine. Other neck: No neck mass or adenopathy. Upper chest: There is mild interstitial thickening, patchy ground-glass opacities and scattered nodular opacities. There is conglomerate mediastinal adenopathy. Review of the MIP images confirms the above findings CTA HEAD FINDINGS Anterior circulation: Intracranial internal carotid arteries are patent with minor calcified plaque. Middle and anterior cerebral arteries are patent. Posterior circulation: Intracranial vertebral arteries are patent. Basilar artery is patent. Posterior cerebral arteries are patent. Venous sinuses: As permitted by contrast timing, patent. 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 There is reconstitution at the level of the V3 segment. Otherwise, no large vessel occlusion or hemodynamically significant stenosis. Partially imaged abnormal thoracic findings including pulmonary ground-glass and nodular opacities and conglomerate mediastinal adenopathy. Dedicated chest imaging is recommended. These results were communicated to Dr. KLeonel Ramsayat 2:25 pmon 12/24/2020by text page via the [facility] system. Electronically Signed   By: PMacy MisM.D.   On: 04/02/2019 14:30   MR BRAIN WO  CONTRAST  Result Date: 04/02/2019 CLINICAL DATA:  Acute onset of right lower extremity weakness beginning 7.5 hours ago. EXAM: MRI HEAD WITHOUT CONTRAST TECHNIQUE: Multiplanar, multiecho pulse sequences of the brain and surrounding structures were obtained without intravenous contrast. COMPARISON:  CTA head and neck 03/03/2019 FINDINGS: Brain: Scattered areas of restricted diffusion are present throughout the left ACA distribution. Many of these are along the ACA/ MCA watershed. There is restricted diffusion involving the anterior genu of the corpus callosum on the left. Single right-sided lesion is present anteriorly in the medial right frontal lobe measuring 6.5 mm. The T2 hyperintense lesion in the left corona radiata does not show restricted diffusion. There is T2 shine through. T2 signal changes are associated with the areas of acute infarction. Periventricular T2 hyperintensities are present separately as well, moderately advanced for age. Vascular: Flow is present in the major intracranial arteries. Skull and upper cervical spine: Degenerative changes are present in the cervical spine. Craniocervical junction is normal. Marrow signal is normal. Sinuses/Orbits: The paranasal sinuses and mastoid air cells are clear. Is some fluid in the mastoid air cells, left greater than right. No obstructing nasopharyngeal lesion is present. Left globe replacement is noted. Cataract surgery is noted on the right. IMPRESSION: 1. Scattered areas of acute/subacute nonhemorrhagic infarction involving the left ACA distribution, ACA MCA watershed, and anterior genu of the corpus callosum on the left. 2. No other acute intracranial abnormality. 3. Atrophy and white matter disease is moderately advanced for age. This likely reflects the sequela of chronic microvascular ischemia.  4. Bilateral mastoid effusions, left greater than right. No obstructing nasopharyngeal lesion is present. The above was relayed via text pager to [facility] on 04/02/2019 at 16:11 . Electronically Signed   By: San Morelle M.D.   On: 04/02/2019 16:12   DG Chest Portable 1 View  Result Date: 04/02/2019 CLINICAL DATA:  Mediastinal adenopathy and multiple poorly defined pulmonary densities seen on CT scan of the neck dated 04/02/2019. EXAM: PORTABLE CHEST 1 VIEW COMPARISON:  CT scan of the neck dated 04/02/2019 FINDINGS: The heart size and pulmonary vascularity are normal. There is slight fullness in mediastinum but this is much less obvious than on the CT scan where the mediastinal adenopathy is quite prominent. There are faint areas of subtle density in the lung apices and at the right midzone but these are not nearly as a apparent as on the prior CT scan. Slight atelectasis at the lung bases. No effusions. No bone abnormality. IMPRESSION: 1. The mediastinal adenopathy is not as apparent on chest x-ray as on the CT scan. Does the patient have a history of sarcoidosis? CT scan of the chest with contrast may help further delineate the extent of the abnormalities. 2. Subtle areas of density in the lung apices and at the right midzone. 3. Slight atelectasis at the lung bases. Electronically Signed   By: Lorriane Shire M.D.   On: 04/02/2019 17:06   ECHOCARDIOGRAM COMPLETE  Result Date: 04/03/2019   ECHOCARDIOGRAM REPORT   Patient Name:   Kathleen George Date of Exam: 04/03/2019 Medical Rec #:  [id number]    Height:       62.0 in Accession #:    [id number]   Weight:       130.0 lb Date of Birth:  1960-07-15   BSA:          1.59 m Patient Age:    12 years     BP:           104/75 mmHg Patient Gender: F            HR:           88 bpm. Exam Location:  Inpatient Procedure: 2D Echo Indications:    Stroke I163.9  History:        Patient has no prior history of Echocardiogram examinations.                 Risk Factors:Hypertension and Diabetes.  Sonographer:    Mikki Santee RDCS (AE) Referring Phys: [id number] CARINA M BROWN IMPRESSIONS  1. Left  ventricular ejection fraction, by visual estimation, is 60 to 65%. The left ventricle has normal function. There is mildly increased left ventricular hypertrophy.  2. Elevated left ventricular end-diastolic pressure.  3. Left ventricular diastolic parameters are consistent with Grade I diastolic dysfunction (impaired relaxation).  4. The left ventricle has no regional wall motion abnormalities.  5. Global right ventricle has mildly reduced systolic function.The right ventricular size is normal. No increase in right ventricular wall thickness.  6. Left atrial size was normal.  7. Right atrial size was normal.  8. The mitral valve is grossly normal. Trivial mitral valve regurgitation.  9. The tricuspid valve is grossly normal. 10. The aortic valve is tricuspid. Aortic valve regurgitation is not visualized. No evidence of aortic valve sclerosis or stenosis. 11. The pulmonic valve was grossly normal. Pulmonic valve regurgitation is not visualized. 12. Normal pulmonary artery systolic pressure. FINDINGS  Left Ventricle: Left ventricular ejection fraction, by visual estimation, is  60 to 65%. The left ventricle has normal function. The left ventricle has no regional wall motion abnormalities. There is mildly increased left ventricular hypertrophy. Concentric left ventricular hypertrophy. Left ventricular diastolic parameters are consistent with Grade I diastolic dysfunction (impaired relaxation). Elevated left ventricular end-diastolic pressure. Right Ventricle: The right ventricular size is normal. No increase in right ventricular wall thickness. Global RV systolic function is has mildly reduced systolic function. The tricuspid regurgitant velocity is 2.24 m/s, and with an assumed right atrial pressure of 3 mmHg, the estimated right ventricular systolic pressure is normal at 23.2 mmHg. Left Atrium: Left atrial size was normal in size. Right Atrium: Right atrial size was normal in size Pericardium: There is no evidence of  pericardial effusion. Mitral Valve: The mitral valve is grossly normal. Trivial mitral valve regurgitation. Tricuspid Valve: The tricuspid valve is grossly normal. Tricuspid valve regurgitation is mild. Aortic Valve: The aortic valve is tricuspid. Aortic valve regurgitation is not visualized. The aortic valve is structurally normal, with no evidence of sclerosis or stenosis. Pulmonic Valve: The pulmonic valve was grossly normal. Pulmonic valve regurgitation is not visualized. Pulmonic regurgitation is not visualized. Aorta: The aortic root is normal in size and structure. IAS/Shunts: No atrial level shunt detected by color flow Doppler.  LEFT VENTRICLE PLAX 2D LVIDd:         3.60 cm  Diastology LVIDs:         2.20 cm  LV e' lateral:   8.49 cm/s LV PW:         1.20 cm  LV E/e' lateral: 8.3 LV IVS:        1.20 cm  LV e' medial:    4.57 cm/s LVOT diam:     2.00 cm  LV E/e' medial:  15.4 LV SV:         38 ml LV SV Index:   23.67 LVOT Area:     3.14 cm  RIGHT VENTRICLE RV S prime:     8.81 cm/s TAPSE (M-mode): 1.2 cm LEFT ATRIUM             Index       RIGHT ATRIUM           Index LA diam:        3.20 cm 2.01 cm/m  RA Area:     10.60 cm LA Vol (A2C):   21.6 ml 13.57 ml/m RA Volume:   19.10 ml  12.00 ml/m LA Vol (A4C):   18.5 ml 11.62 ml/m LA Biplane Vol: 20.2 ml 12.69 ml/m  AORTIC VALVE LVOT Vmax:   81.30 cm/s LVOT Vmean:  52.100 cm/s LVOT VTI:    0.154 m  AORTA Ao Root diam: 2.60 cm MITRAL VALVE                        TRICUSPID VALVE MV Area (PHT): 4.17 cm             TR Peak grad:   20.2 mmHg MV PHT:        52.78 msec           TR Vmax:        243.00 cm/s MV Decel Time: 182 msec MV E velocity: 70.30 cm/s 103 cm/s  SHUNTS MV A velocity: 66.90 cm/s 70.3 cm/s Systemic VTI:  0.15 m MV E/A ratio:  1.05       1.5       Systemic Diam: 2.00 cm  Kate Sable MD  Electronically signed by Kate Sable MD Signature Date/Time: 04/03/2019/9:28:16 AM    Final    CT HEAD CODE STROKE WO CONTRAST  Result Date:  04/02/2019 CLINICAL DATA:  Code stroke.  Right-sided weakness EXAM: CT HEAD WITHOUT CONTRAST TECHNIQUE: Contiguous axial images were obtained from the base of the skull through the vertex without intravenous contrast. COMPARISON:  None. FINDINGS: Brain: There is no acute intracranial hemorrhage, mass effect, or evidence of acute infarction. Gray-white differentiation is preserved. Ventricles and sulci are normal in size and configuration. Patchy hypoattenuation in the supratentorial white matter is nonspecific but may reflect minor chronic microvascular ischemic changes. There is no extra-axial collection. Vascular: No hyperdense vessel or unexpected calcification. Skull: Unremarkable. Sinuses/Orbits: Mild mucosal thickening.  Left ocular prosthesis. Other: Opacification of the left mastoid tip. ASPECTS [facility] Stroke Program Early CT Score) - Ganglionic level infarction (caudate, lentiform nuclei, internal capsule, insula, M1-M3 cortex): 7 - Supraganglionic infarction (M4-M6 cortex): 3 Total score (0-10 with 10 being normal): 10 IMPRESSION: 1. No acute intracranial hemorrhage or evidence of acute infarction. 2. ASPECTS is 10. 3. Minor chronic microvascular ischemic changes. These results were communicated to Dr. Leonel Ramsay at 2:02 pmon 12/24/2020by text page via the [facility] messaging system. Electronically Signed   By: Macy Mis M.D.   On: 04/02/2019 14:04   Results/Tests Pending at Time of Discharge: None  Discharge Medications:  Allergies as of 04/03/2019      Reactions   Penicillins Anaphylaxis   Did it involve swelling of the face/tongue/throat, SOB, or low BP? Yes Did it involve sudden or severe rash/hives, skin peeling, or any reaction on the inside of your mouth or nose? N/A Did you need to seek medical attention at a hospital or doctor's office? N/A When did it last happen?Child If all above answers are "NO", may proceed with cephalosporin use.      Medication List    STOP taking  these medications   amLODipine 5 MG tablet Commonly known as: NORVASC   lisinopril 10 MG tablet Commonly known as: ZESTRIL   metoprolol tartrate 25 MG tablet Commonly known as: LOPRESSOR     TAKE these medications   aspirin 81 MG EC tablet Take 1 tablet (81 mg total) by mouth daily. Start taking on: April 04, 2019   atorvastatin 80 MG tablet Commonly known as: LIPITOR Take 1 tablet (80 mg total) by mouth daily at 6 PM.   clopidogrel 75 MG tablet Commonly known as: PLAVIX Take 1 tablet (75 mg total) by mouth daily for 21 days. Start taking on: April 04, 2019   dorzolamide-timolol 22.3-6.8 MG/ML ophthalmic solution Commonly known as: COSOPT Place 1 drop into the right eye 2 (two) times daily.   erythromycin ophthalmic ointment Place 1 application into the left eye at bedtime.   liraglutide 18 MG/3ML Sopn Commonly known as: VICTOZA Inject 1.2 mg into the skin every evening.            Durable Medical Equipment  (From admission, onward)         Start     Ordered   04/03/19 1530  For home use only DME Walker rolling  Once    Question Answer Comment  Patient needs a walker to treat with the following condition Weakness   Patient needs a walker to treat with the following condition CVA (cerebral vascular accident) [facility])      04/03/19 1529          Discharge Instructions: Please refer to Patient Instructions section of  EMR for full details.  Patient was counseled important signs and symptoms that should prompt return to medical care, changes in medications, dietary instructions, activity restrictions, and follow up appointments.   Follow-Up Appointments: Follow-up Information    Garvin Fila, MD. Call.   Specialties: Neurology, Radiology Why: on 12/28 if you do not hear from them about a follow up appointment. Contact information: [address]  [place] [address] [phone]           Follow up with PCP in 1 week.  Patient  does not live in [place].  [place], DO 04/03/2019, 5:32 PM PGY-2, [place]

## 2019-04-03 NOTE — Progress Notes (Signed)
STROKE TEAM PROGRESS NOTE   INTERVAL HISTORY No family is at the bedside.  I have personally reviewed history of presenting illness, electronic medical records and imaging films in PACS.  She presented with sudden onset of right leg weakness which has improved partially but she feels is not back to baseline.  MRI scan shows patchy left ACA/MCA border zone infarct along with a solitary punctate tiny right medial frontal infarct as well.  CT angiogram shows what appears to be chronic right vertebral artery occlusion with no significant anterior circulation disease.  LDL cholesterol 76 mg percent and hemoglobin A1c 7.2. Vitals:   04/02/19 2317 04/03/19 0117 04/03/19 0317 04/03/19 0919  BP: 103/73 102/74 104/75 (!) 112/98  Pulse: 72 92 84 86  Resp: _0 Temp: 98.7 F (37.1 C) 98.7 F (37.1 C) 98.8 F (37.1 C) 97.9 F (36.6 C)  TempSrc: Oral Oral Oral Oral  SpO2: 99% 99% 98% 98%  Weight:      Height:        CBC:  Recent Labs  Lab 04/02/19 1410 04/02/19 1422 04/03/19 0300  WBC 10.0  --  9.2  NEUTROABS 4.8  --   --   HGB 13.9 14.6 13.7  HCT 42.0 43.0 40.4  MCV 91.7  --  88.4  PLT 353  --  244    Basic Metabolic Panel:  Recent Labs  Lab 04/02/19 1410 04/02/19 1422 04/03/19 0300  NA 139 139 139  K 4.1 3.9 3.8  CL 102 102 101  CO2 25  --  26  GLUCOSE 172* 168* 165*  BUN _1 CREATININE 0.88 0.70 0.70  CALCIUM 9.3  --  9.0   Lipid Panel:     Component Value Date/Time   CHOL 178 04/03/2019 0300   TRIG 384 (H) 04/03/2019 0300   HDL 25 (L) 04/03/2019 0300   CHOLHDL 7.1 04/03/2019 0300   VLDL 77 (H) 04/03/2019 0300   LDLCALC 76 04/03/2019 0300   HgbA1c:  Lab Results  Component Value Date   HGBA1C 7.2 (H) 04/03/2019   Urine Drug Screen:     Component Value Date/Time   LABOPIA NONE DETECTED 04/02/2019 1730   COCAINSCRNUR NONE DETECTED 04/02/2019 1730   LABBENZ NONE DETECTED 04/02/2019 1730   AMPHETMU NONE DETECTED 04/02/2019 1730   THCU NONE DETECTED  04/02/2019 1730   LABBARB NONE DETECTED 04/02/2019 1730    Alcohol Level No results found for: ETH  IMAGING CT ANGIO HEAD W OR WO CONTRAST  Result Date: 04/02/2019 CLINICAL DATA:  Right-sided weakness EXAM: CT ANGIOGRAPHY HEAD AND NECK TECHNIQUE: Multidetector CT imaging of the head and neck was performed using the standard protocol during bolus administration of intravenous contrast. Multiplanar CT image reconstructions and MIPs were obtained to evaluate the vascular anatomy. Carotid stenosis measurements (when applicable) are obtained utilizing NASCET criteria, using the distal internal carotid diameter as the denominator. CONTRAST:  69m OMNIPAQUE IOHEXOL 350 MG/ML SOLN COMPARISON:  None. FINDINGS: CTA NECK FINDINGS Aortic arch: Great vessel origins are patent. Right carotid system: Common, internal, and external carotid arteries are patent. There is no measurable stenosis or evidence of dissection. Left carotid system: Common, internal, and external carotid arteries are patent. There is no measurable stenosis or evidence of dissection. Vertebral arteries: Left vertebral artery is patent. There is no visible enhancement of the right vertebral artery until the V3 segment. Skeleton: Multilevel degenerative changes of the cervical spine. Other neck: No neck mass or adenopathy. Upper chest:  There is mild interstitial thickening, patchy ground-glass opacities and scattered nodular opacities. There is conglomerate mediastinal adenopathy. Review of the MIP images confirms the above findings CTA HEAD FINDINGS Anterior circulation: Intracranial internal carotid arteries are patent with minor calcified plaque. Middle and anterior cerebral arteries are patent. Posterior circulation: Intracranial vertebral arteries are patent. Basilar artery is patent. Posterior cerebral arteries are patent. Venous sinuses: As permitted by contrast timing, patent. 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 There is reconstitution at the level of the V3 segment. Otherwise, no large vessel occlusion or hemodynamically significant stenosis. Partially imaged abnormal thoracic findings including pulmonary ground-glass and nodular opacities and conglomerate mediastinal adenopathy. Dedicated chest imaging is recommended. These results were communicated to Dr. Leonel Ramsay at 2:25 pmon 12/24/2020by text page via the [facility] messaging system. Electronically Signed   By: Macy Mis M.D.   On: 04/02/2019 14:30   CT ANGIO NECK W OR WO CONTRAST  Result Date: 04/02/2019 CLINICAL DATA:  Right-sided weakness EXAM: CT ANGIOGRAPHY HEAD AND NECK TECHNIQUE: Multidetector CT imaging of the head and neck was performed using the standard protocol during bolus administration of intravenous contrast. Multiplanar CT image reconstructions and MIPs were obtained to evaluate the vascular anatomy. Carotid stenosis measurements (when applicable) are obtained utilizing NASCET criteria, using the distal internal carotid diameter as the denominator. CONTRAST:  46m OMNIPAQUE IOHEXOL 350 MG/ML SOLN COMPARISON:  None. FINDINGS: CTA NECK FINDINGS Aortic arch: Great vessel origins are patent. Right carotid system: Common, internal, and external carotid arteries are patent. There is no measurable stenosis or evidence of dissection. Left carotid system: Common, internal, and external carotid arteries are patent. There is no measurable stenosis or evidence of dissection. Vertebral arteries: Left vertebral artery is patent. There is no visible enhancement of the right vertebral artery until the V3 segment. Skeleton: Multilevel degenerative changes of the cervical spine. Other neck: No neck mass or adenopathy. Upper chest: There is mild interstitial thickening, patchy ground-glass opacities and scattered nodular opacities. There is conglomerate mediastinal adenopathy. Review of the MIP  images confirms the above findings CTA HEAD FINDINGS Anterior circulation: Intracranial internal carotid arteries are patent with minor calcified plaque. Middle and anterior cerebral arteries are patent. Posterior circulation: Intracranial vertebral arteries are patent. Basilar artery is patent. Posterior cerebral arteries are patent. Venous sinuses: As permitted by contrast timing, patent. 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 There is reconstitution at the level of the V3 segment. Otherwise, no large vessel occlusion or hemodynamically significant stenosis. Partially imaged abnormal thoracic findings including pulmonary ground-glass and nodular opacities and conglomerate mediastinal adenopathy. Dedicated chest imaging is recommended. These results were communicated to Dr. KLeonel Ramsayat 2:25 pmon 12/24/2020by text page via the [facility]. Electronically Signed   By: PMacy MisM.D.   On: 04/02/2019 14:30   MR BRAIN WO CONTRAST  Result Date: 04/02/2019 CLINICAL DATA:  Acute onset of right lower extremity weakness beginning 7.5 hours ago. EXAM: MRI HEAD WITHOUT CONTRAST TECHNIQUE: Multiplanar, multiecho pulse sequences of the brain and surrounding structures were obtained without intravenous contrast. COMPARISON:  CTA head and neck 03/03/2019 FINDINGS: Brain: Scattered areas of restricted diffusion are present throughout the left ACA distribution. Many of these are along the ACA/ MCA watershed. There is restricted diffusion involving the anterior genu of the corpus callosum on the left. Single right-sided lesion is present anteriorly in  the medial right frontal lobe measuring 6.5 mm. The T2 hyperintense lesion in the left corona radiata does not show restricted diffusion. There is T2 shine through. T2 signal changes are associated with the areas of acute infarction. Periventricular T2 hyperintensities  are present separately as well, moderately advanced for age. Vascular: Flow is present in the major intracranial arteries. Skull and upper cervical spine: Degenerative changes are present in the cervical spine. Craniocervical junction is normal. Marrow signal is normal. Sinuses/Orbits: The paranasal sinuses and mastoid air cells are clear. Is some fluid in the mastoid air cells, left greater than right. No obstructing nasopharyngeal lesion is present. Left globe replacement is noted. Cataract surgery is noted on the right. IMPRESSION: 1. Scattered areas of acute/subacute nonhemorrhagic infarction involving the left ACA distribution, ACA MCA watershed, and anterior genu of the corpus callosum on the left. 2. No other acute intracranial abnormality. 3. Atrophy and white matter disease is moderately advanced for age. This likely reflects the sequela of chronic microvascular ischemia. 4. Bilateral mastoid effusions, left greater than right. No obstructing nasopharyngeal lesion is present. The above was relayed via text pager to [facility] on 04/02/2019 at 16:11 . Electronically Signed   By: San Morelle M.D.   On: 04/02/2019 16:12   DG Chest Portable 1 View  Result Date: 04/02/2019 CLINICAL DATA:  Mediastinal adenopathy and multiple poorly defined pulmonary densities seen on CT scan of the neck dated 04/02/2019. EXAM: PORTABLE CHEST 1 VIEW COMPARISON:  CT scan of the neck dated 04/02/2019 FINDINGS: The heart size and pulmonary vascularity are normal. There is slight fullness in mediastinum but this is much less obvious than on the CT scan where the mediastinal adenopathy is quite prominent. There are faint areas of subtle density in the lung apices and at the right midzone but these are not nearly as a apparent as on the prior CT scan. Slight atelectasis at the lung bases. No effusions. No bone abnormality. IMPRESSION: 1. The mediastinal adenopathy is not as apparent on chest x-ray as on the CT  scan. Does the patient have a history of sarcoidosis? CT scan of the chest with contrast may help further delineate the extent of the abnormalities. 2. Subtle areas of density in the lung apices and at the right midzone. 3. Slight atelectasis at the lung bases. Electronically Signed   By: Lorriane Shire M.D.   On: 04/02/2019 17:06   ECHOCARDIOGRAM COMPLETE  Result Date: 04/03/2019   ECHOCARDIOGRAM REPORT   Patient Name:   Kathleen George Date of Exam: 04/03/2019 Medical Rec #:  [id number]    Height:       62.0 in Accession #:    [id number]   Weight:       130.0 lb Date of Birth:  06/09/1960   BSA:          1.59 m Patient Age:    63 years     BP:           104/75 mmHg Patient Gender: F            HR:           88 bpm. Exam Location:  Inpatient Procedure: 2D Echo Indications:    Stroke I163.9  History:        Patient has no prior history of Echocardiogram examinations.                 Risk Factors:Hypertension and Diabetes.  Sonographer:    Mikki Santee RDCS (AE) Referring  Phys: [id number] CARINA M BROWN IMPRESSIONS  1. Left ventricular ejection fraction, by visual estimation, is 60 to 65%. The left ventricle has normal function. There is mildly increased left ventricular hypertrophy.  2. Elevated left ventricular end-diastolic pressure.  3. Left ventricular diastolic parameters are consistent with Grade I diastolic dysfunction (impaired relaxation).  4. The left ventricle has no regional wall motion abnormalities.  5. Global right ventricle has mildly reduced systolic function.The right ventricular size is normal. No increase in right ventricular wall thickness.  6. Left atrial size was normal.  7. Right atrial size was normal.  8. The mitral valve is grossly normal. Trivial mitral valve regurgitation.  9. The tricuspid valve is grossly normal. 10. The aortic valve is tricuspid. Aortic valve regurgitation is not visualized. No evidence of aortic valve sclerosis or stenosis. 11. The pulmonic valve was grossly  normal. Pulmonic valve regurgitation is not visualized. 12. Normal pulmonary artery systolic pressure. FINDINGS  Left Ventricle: Left ventricular ejection fraction, by visual estimation, is 60 to 65%. The left ventricle has normal function. The left ventricle has no regional wall motion abnormalities. There is mildly increased left ventricular hypertrophy. Concentric left ventricular hypertrophy. Left ventricular diastolic parameters are consistent with Grade I diastolic dysfunction (impaired relaxation). Elevated left ventricular end-diastolic pressure. Right Ventricle: The right ventricular size is normal. No increase in right ventricular wall thickness. Global RV systolic function is has mildly reduced systolic function. The tricuspid regurgitant velocity is 2.24 m/s, and with an assumed right atrial pressure of 3 mmHg, the estimated right ventricular systolic pressure is normal at 23.2 mmHg. Left Atrium: Left atrial size was normal in size. Right Atrium: Right atrial size was normal in size Pericardium: There is no evidence of pericardial effusion. Mitral Valve: The mitral valve is grossly normal. Trivial mitral valve regurgitation. Tricuspid Valve: The tricuspid valve is grossly normal. Tricuspid valve regurgitation is mild. Aortic Valve: The aortic valve is tricuspid. Aortic valve regurgitation is not visualized. The aortic valve is structurally normal, with no evidence of sclerosis or stenosis. Pulmonic Valve: The pulmonic valve was grossly normal. Pulmonic valve regurgitation is not visualized. Pulmonic regurgitation is not visualized. Aorta: The aortic root is normal in size and structure. IAS/Shunts: No atrial level shunt detected by color flow Doppler.  LEFT VENTRICLE PLAX 2D LVIDd:         3.60 cm  Diastology LVIDs:         2.20 cm  LV e' lateral:   8.49 cm/s LV PW:         1.20 cm  LV E/e' lateral: 8.3 LV IVS:        1.20 cm  LV e' medial:    4.57 cm/s LVOT diam:     2.00 cm  LV E/e' medial:  15.4 LV  SV:         38 ml LV SV Index:   23.67 LVOT Area:     3.14 cm  RIGHT VENTRICLE RV S prime:     8.81 cm/s TAPSE (M-mode): 1.2 cm LEFT ATRIUM             Index       RIGHT ATRIUM           Index LA diam:        3.20 cm 2.01 cm/m  RA Area:     10.60 cm LA Vol (A2C):   21.6 ml 13.57 ml/m RA Volume:   19.10 ml  12.00 ml/m LA Vol (A4C):   18.5  ml 11.62 ml/m LA Biplane Vol: 20.2 ml 12.69 ml/m  AORTIC VALVE LVOT Vmax:   81.30 cm/s LVOT Vmean:  52.100 cm/s LVOT VTI:    0.154 m  AORTA Ao Root diam: 2.60 cm MITRAL VALVE                        TRICUSPID VALVE MV Area (PHT): 4.17 cm             TR Peak grad:   20.2 mmHg MV PHT:        52.78 msec           TR Vmax:        243.00 cm/s MV Decel Time: 182 msec MV E velocity: 70.30 cm/s 103 cm/s  SHUNTS MV A velocity: 66.90 cm/s 70.3 cm/s Systemic VTI:  0.15 m MV E/A ratio:  1.05       1.5       Systemic Diam: 2.00 cm  Kate Sable MD Electronically signed by Kate Sable MD Signature Date/Time: 04/03/2019/9:28:16 AM    Final    CT HEAD CODE STROKE WO CONTRAST  Result Date: 04/02/2019 CLINICAL DATA:  Code stroke.  Right-sided weakness EXAM: CT HEAD WITHOUT CONTRAST TECHNIQUE: Contiguous axial images were obtained from the base of the skull through the vertex without intravenous contrast. COMPARISON:  None. FINDINGS: Brain: There is no acute intracranial hemorrhage, mass effect, or evidence of acute infarction. Gray-white differentiation is preserved. Ventricles and sulci are normal in size and configuration. Patchy hypoattenuation in the supratentorial white matter is nonspecific but may reflect minor chronic microvascular ischemic changes. There is no extra-axial collection. Vascular: No hyperdense vessel or unexpected calcification. Skull: Unremarkable. Sinuses/Orbits: Mild mucosal thickening.  Left ocular prosthesis. Other: Opacification of the left mastoid tip. ASPECTS [facility] Stroke Program Early CT Score) - Ganglionic level infarction (caudate, lentiform  nuclei, internal capsule, insula, M1-M3 cortex): 7 - Supraganglionic infarction (M4-M6 cortex): 3 Total score (0-10 with 10 being normal): 10 IMPRESSION: 1. No acute intracranial hemorrhage or evidence of acute infarction. 2. ASPECTS is 10. 3. Minor chronic microvascular ischemic changes. These results were communicated to Dr. Leonel Ramsay at 2:02 pmon 12/24/2020by text page via the [facility] messaging system. Electronically Signed   By: Macy Mis M.D.   On: 04/02/2019 14:04    PHYSICAL EXAM Pleasant middle-aged African-American lady not in distress. . Afebrile. Head is nontraumatic. Neck is supple without bruit.    Cardiac exam no murmur or gallop. Lungs are clear to auscultation. Distal pulses are well felt. Neurological Exam ;  Awake  Alert oriented x 3. Normal speech and language.eye movements full without nystagmus.fundi were not visualized. Vision acuity and fields appear normal in the right eye and left eye is prosthetic.Marland Kitchen Hearing is normal. Palatal movements are normal. Face symmetric. Tongue midline. Normal strength, tone, reflexes and coordination.  Very minimum weakness in right hip flexors on double simultaneous testing only.  Normal sensation. Gait deferred. ASSESSMENT/PLAN [name] is a 58 y.o. female with history of , hypertension, hypercholesterolemia and diabetes presenting with R sided weakness.   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 Small vessel disease. ASPECTS 10.     CTA head & neck R VA occlusion w/ reconstitution at V3. Thoracic ground-glass and nodular opacities and conglomerate mediastinal adenopathy.  MRI  Scattered L ACA, ACA MCA watershed and anterior genu of the corpus callosum infarcts. Small vessel  disease. Atrophy. B mastoid effusions.  2D Echo EF 60-65 %. No source of embolus   TCD bubble study pending  LE dopplers pending   Needs TEE to assess for source  of stroke, place loop - can be done as an OP - contact cardiology to arrange (I sent them a message in Epic)  LDL 76  HgbA1c 7.2  HIV neg  ANA, ESR, CL ab, Sickle cell screen, lupus ac, RPR, ACE pending   Heparin 5000 units sq tid for VTE prophylaxis  No antithrombotic prior to admission, placed on aspirin 81 mg daily and clopidogrel 75 mg daily. Plan DAPT x 3 weeks then aspirin alone  Therapy recommendations:  pending   Disposition:  pending   Hypertension  Stable . Permissive hypertension (OK if < 220/120) but gradually normalize in 5-7 days . Long-term BP goal normotensive  Hyperlipidemia  Home meds:  No statin  Now on lipitor 80  LDL 76, goal < 70  Continue statin at discharge  Diabetes type II Uncontrolled  HgbA1c 7.2, goal < 7.0  Other Stroke Risk Factors  Former Cigarette smoker  Hx ETOH use  Hx stroke/TIA  At 40 yrs of age no deficits that she is aware of    Other Active Problems  L eye vision poor, d/t glaucoma    Known Sarcoidosis, CTA shows evidence of  Hospital day # 1  I have personally obtained history,examined this patient, reviewed notes, independently viewed imaging studies, participated in medical decision making and plan of care.ROS completed by me personally and pertinent positives fully documented  I have made any additions or clarifications directly to the above note.  She has presented with embolic left ACA as well as tiny right ACA infarcts from unknown source.  Check echocardiogram results and she can possibly be discharged home after that if she can walk safely.  She will need elective transesophageal echocardiogram as well as loop recorder to look for cardiac source of embolism.  Also check transcranial Doppler bubble study for PFO, antiphospholipid antibodies, lupus anticoagulant, ANA panel, angiotensin-converting enzyme levels and ESR.  Recommend aspirin and Plavix for 3 weeks followed by aspirin alone and aggressive risk factor  modification.  Follow-up as an outpatient stroke clinic in 6 weeks.  Greater than 50% time during this 35-minute visit was spent on counseling and coordination of care about her stroke and discussion about stroke prevention and treatment and discussion with care team  Antony Contras, MD Medical Director [place] Pager: [phone] 04/03/2019 1:44 PM   To contact Stroke Continuity provider, please refer to [email or web address] After hours, contact General Neurology

## 2019-04-03 NOTE — Discharge Instructions (Signed)
You should be contacted in the next few days regarding follow up with neurology and cardiology.  If you don't hear from them in the next 5 days, please call Dr. Marlis Edelson office at [phone], he is the neurologist that helped take care of you during your admission.   You will take aspirin and plavix for the next 21 days, then stop taking plavix and just take aspirin.    You should follow up with cardiology as well as you may need a loop recorder to monitor your heart to see if this is a reason you had a stroke.  Don't take your blood pressure medications for the next few days until you are told to by a doctor.  Make an appointment with your regular doctor on 12/28-12/30.    Come back if your symptoms worsen or you start to have new problems like loss of consciousness, a severe headache, chest pain, vision changes, new weakness, slurring or speech.  Hospital Discharge After a Stroke  Being discharged from the hospital after a stroke can feel overwhelming. Many things may be different, and it is normal to feel scared or anxious. Some stroke survivors may be able to return to their homes, and others may need more specialized care on a temporary or permanent basis. Your stroke care team will work with you to develop a discharge plan that is best for you. Ask questions if you do not understand something. Invite a friend or family member to participate in discharge planning. Understanding and following your discharge plan can help to prevent another stroke or other problems. Understanding your medicines After a stroke, your health care provider may prescribe one or more types of medicine. It is important to take medicines exactly as told by your health care provider. Serious harm, such as another stroke, can happen if you are unable to take your medicine exactly as prescribed. Make sure you understand:  What medicine to take.  Why you are taking the medicine.  How and when to take it.  If it can  be taken with your other medicines and herbal supplements.  Possible side effects.  When to call your health care provider if you have any side effects.  How you will get and pay for your medicines. Medical assistance programs may be able to help you pay for prescription medicines if you cannot afford them. If you are taking an anticoagulant, be sure to take it exactly as told by your health care provider. This type of medicine can increase the risk of bleeding because it works to prevent blood from clotting. You may need to take certain precautions to prevent bleeding. You should contact your health care provider if you have:  Bleeding or bruising.  A fall or other injury to your head.  Blood in your urine or stool (feces). Planning for home safety  Take steps to prevent falls, such as installing grab bars or using a shower chair. Ask a friend or family member to get needed things in place before you go home if possible. A therapist can come to your home to make recommendations for safety equipment. Ask your health care provider if you would benefit from this service or from home care. Getting needed equipment Ask your health care provider for a list of any medical equipment and supplies you will need at home. These may include items such as:  Walkers.  Canes.  Wheelchairs.  Hand-strengthening devices.  Special eating utensils. Medical equipment can be rented or purchased,  depending on your insurance coverage. Check with your insurance company about what is covered. Keeping follow-up visits After a stroke, you will need to follow up regularly with a health care provider. You may also need rehabilitation, which can include physical therapy, occupational therapy, or speech-language therapy. Keeping these appointments is very important to your recovery after a stroke. Be sure to bring your medicine list and discharge papers with you to your appointments. If you need help to keep track  of your schedule, use a calendar or appointment reminder. Preventing another stroke Having a stroke puts you at risk for another stroke in the future. Ask your health care provider what actions you can take to lower the risk. These may include:  Increasing how much you exercise.  Making a healthy eating plan.  Quitting smoking.  Managing other health conditions, such as high blood pressure, high cholesterol, or diabetes.  Limiting alcohol use. Knowing the warning signs of a stroke  Make sure you understand the signs of a stroke. Before you leave the hospital, you will receive information outlining the stroke warning signs. Share these with your friends and family members. "BE FAST" is an easy way to remember the main warning signs of a stroke:  B - Balance. Signs are dizziness, sudden trouble walking, or loss of balance.  E - Eyes. Signs are trouble seeing or a sudden change in vision.  F - Face. Signs are sudden weakness or numbness of the face, or the face or eyelid drooping on one side.  A - Arms. Signs are weakness or numbness in an arm. This happens suddenly and usually on one side of the body.  S - Speech. Signs are sudden trouble speaking, slurred speech, or trouble understanding what people say.  T - Time. Time to call emergency services. Write down what time symptoms started. Other signs of stroke may include:  A sudden, severe headache with no known cause.  Nausea or vomiting.  Seizure. These symptoms may represent a serious problem that is an emergency. Do not wait to see if the symptoms will go away. Get medical help right away. Call your local emergency services (911 in the [place] Do not drive yourself to the hospital. Make note of the time that you had your first symptoms. Your emergency responders or emergency room staff will need to know this information. Summary  Being discharged from the hospital after a stroke can feel overwhelming. It is normal to feel  scared or anxious.  Make sure you take medicines exactly as told by your health care provider.  Know the warning signs of a stroke, and get help right way if you have any of these symptoms. "BE FAST" is an easy way to remember the main warning signs of a stroke. This information is not intended to replace advice given to you by your health care provider. Make sure you discuss any questions you have with your health care provider. Document Released: 06/29/2016 Document Revised: 03/29/2017 Document Reviewed: 06/29/2016 Elsevier Patient Education  2020 [employer].

## 2019-04-03 NOTE — Progress Notes (Signed)
  Echocardiogram 2D Echocardiogram has been performed.  Jennette Dubin 04/03/2019, 8:37 AM

## 2019-04-03 NOTE — Progress Notes (Addendum)
Family Medicine Teaching Service Daily Progress Note Intern Pager: [phone]  Patient name: Kathleen George Medical record number: [id number] Date of birth: 1960-09-06 Age: 58 y.o. Gender: female  Primary Care Provider: Patient, No Pcp Per Consultants: Neurology Code Status: Full   Pt Overview and Major Events to Date:  12/24 Admitted to FPTS  Assessment and Plan: Courtland Coppa is a 58 y.o. female presenting with neurological deficits assistant with confirmed cerebrovascular accident in ACA and MCA. PMH is significant for type 2 diabetes, hypertension, hypercholesterolemia, glaucoma and prosthetic left eye globe.  CVA in ACA and MCA  Patient's deficit on admission was right lower extremity weakness.  This a.m. appears back at baseline, normal neuro exam with 5/5 strength BUE/BLE.  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  Otherwise no large vessel occlusion or stenosis.  Echo this AM with normal EF 60-65%, G1DD.  A1c 7.2, lipid panel with LDL 76, triglycerides 384, total cholesterol 178.  HIV nonreactive.  Ongoing for permissive hypertension, but continued metoprolol at 12.5mg  due to risk of rebound tachycardia.  Patient's blood pressure this morning 104/75.  Heart rate in 90s overnight. -Neurology following, appreciate recommendations -Aspirin 325 mg daily -SLP eval -cardiac monitoring  -Follow-up echo results - daily BMP  -Tylenol  -Atorvastatin 80mg  daily  -up with assistance -fall precautions  -continuous pulse oximetry  -vitals every 2 hours, permissive hypertension -PT/OT -Hopeful discharge today if cleared by neurology and PT/OT sees  Type 2 diabetes Patient reports history of type 2 diabetes and reports taking insulin twice daily at home.  She also denies being on Metformin previously.  Home med list shows patient has a prescription for Victoza daily.  A1c 7.2 -Sensitive sliding scale insulin  Hypertension Home medications include  metoprolol 25 mg twice daily, lisinopril 10 and amlodipine 5.    Currently on lisinopril and amlodipine for permissive hypertension, metoprolol continued at 12.5 mg twice daily due to risk of rebound tachycardia.  BP overnight 104/75. -Permissive hypertension given acute stroke -Monitor with vital signs -will hold metoprolol in setting of permissive HTN  History of glaucoma  Patient reports history of abnormal ophthalmology exam in which she was diagnosed with glaucoma and complications of diabetes.  She reports undergoing eye surgery in which a prosthetic globe was placed in her left orbit.  Patient regularly sees an ophthalmologist every 2 to 3 months.  Neuro exam and right orbit is within normal limits with extraocular movements intact, no scleral icterus, pupil that is equal, round and reactive to light.  Patient denies any blurry vision in right eye. -No inpatient intervention  HLD Patient reports taking a medication for cholesterol but does not remember the name.  Patient also does not have this medication on her person during this admission.  Lipid panel with LDL of 76.  Elevated triglycerides 384, unclear if patient was fasting. -Start atorvastatin 80 per neurology -Consider follow-up as outpatient, may need specific treatment for triglycerides  Abnormal thoracic findings on CT angio of the neck Patient noted to have partially imaged groundglass and nodular opacities and conglomerate mediastinal adenopathy.  Patient reports history of sarcoidosis and reports history of pulmonary biopsy that showed no malignancy. -Recommend chest CT as outpatient, patient does not remember details of past work-up, may be useful to bring daughter into conversation  FEN/GI: Carb modified PPx: Hep SQ  Disposition: Pending PT/OT  Subjective:  Patient feeling well this morning, hoping to go home later today or tomorrow at the latest.  States  that she feels like she is back to her normal self.  Denies any  complaints.  Objective: Temp:  [98.6 F (37 C)-98.8 F (37.1 C)] 98.8 F (37.1 C) (12/25 0317) Pulse Rate:  [72-96] 84 (12/25 0317) Resp:  [12-27] 18 (12/25 0317) BP: (102-140)/(73-98) 104/75 (12/25 0317) SpO2:  [94 %-100 %] 98 % (12/25 0317) Weight:  [59 kg] 59 kg (12/24 1452)  Physical Exam:  General: 58 y.o. female in NAD HEENT: Left glass eye, right pupil reactive Cardio: RRR no m/r/g Lungs: CTAB, no wheezing, no rhonchi, no crackles, no IWOB on RA Skin: warm and dry Extremities: No edema Neuro: CN II through XII grossly intact, 5/5 strength BUE/BLE, sensation intact throughout  Laboratory: Recent Labs  Lab 04/02/19 1410 04/02/19 1422 04/03/19 0300  WBC 10.0  --  9.2  HGB 13.9 14.6 13.7  HCT 42.0 43.0 40.4  PLT 353  --  382   Recent Labs  Lab 04/02/19 1410 04/02/19 1422 04/03/19 0300  NA 139 139 139  K 4.1 3.9 3.8  CL 102 102 101  CO2 25  --  26  BUN 15 18 17   CREATININE 0.88 0.70 0.70  CALCIUM 9.3  --  9.0  PROT 7.5  --   --   BILITOT 0.6  --   --   ALKPHOS 103  --   --   ALT 32  --   --   AST 22  --   --   GLUCOSE 172* 168* 165*     Imaging/Diagnostic Tests: CT ANGIO HEAD W OR WO CONTRAST  Result Date: 04/02/2019 CLINICAL DATA:  Right-sided weakness EXAM: CT ANGIOGRAPHY HEAD AND NECK TECHNIQUE: Multidetector CT imaging of the head and neck was performed using the standard protocol during bolus administration of intravenous contrast. Multiplanar CT image reconstructions and MIPs were obtained to evaluate the vascular anatomy. Carotid stenosis measurements (when applicable) are obtained utilizing NASCET criteria, using the distal internal carotid diameter as the denominator. CONTRAST:  75mL OMNIPAQUE IOHEXOL 350 MG/ML SOLN COMPARISON:  None. FINDINGS: CTA NECK FINDINGS Aortic arch: Great vessel origins are patent. Right carotid system: Common, internal, and external carotid arteries are patent. There is no measurable stenosis or evidence of dissection.  Left carotid system: Common, internal, and external carotid arteries are patent. There is no measurable stenosis or evidence of dissection. Vertebral arteries: Left vertebral artery is patent. There is no visible enhancement of the right vertebral artery until the V3 segment. Skeleton: Multilevel degenerative changes of the cervical spine. Other neck: No neck mass or adenopathy. Upper chest: There is mild interstitial thickening, patchy ground-glass opacities and scattered nodular opacities. There is conglomerate mediastinal adenopathy. Review of the MIP images confirms the above findings CTA HEAD FINDINGS Anterior circulation: Intracranial internal carotid arteries are patent with minor calcified plaque. Middle and anterior cerebral arteries are patent. Posterior circulation: Intracranial vertebral arteries are patent. Basilar artery is patent. Posterior cerebral arteries are patent. Venous sinuses: As permitted by contrast timing, patent. 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 There is reconstitution at the level of the V3 segment. Otherwise, no large vessel occlusion or hemodynamically significant stenosis. Partially imaged abnormal thoracic findings including pulmonary ground-glass and nodular opacities and conglomerate mediastinal adenopathy. Dedicated chest imaging is recommended. These results were communicated to Dr. Amada JupiterKirkpatrick at 2:25 pmon 12/24/2020by text page via the [facility] messaging system. Electronically Signed   By: Guadlupe SpanishPraneil  Patel M.D.   On:  04/02/2019 14:30   CT ANGIO NECK W OR WO CONTRAST  Result Date: 04/02/2019 CLINICAL DATA:  Right-sided weakness EXAM: CT ANGIOGRAPHY HEAD AND NECK TECHNIQUE: Multidetector CT imaging of the head and neck was performed using the standard protocol during bolus administration of intravenous contrast. Multiplanar CT image reconstructions and MIPs were obtained to  evaluate the vascular anatomy. Carotid stenosis measurements (when applicable) are obtained utilizing NASCET criteria, using the distal internal carotid diameter as the denominator. CONTRAST:  11mL OMNIPAQUE IOHEXOL 350 MG/ML SOLN COMPARISON:  None. FINDINGS: CTA NECK FINDINGS Aortic arch: Great vessel origins are patent. Right carotid system: Common, internal, and external carotid arteries are patent. There is no measurable stenosis or evidence of dissection. Left carotid system: Common, internal, and external carotid arteries are patent. There is no measurable stenosis or evidence of dissection. Vertebral arteries: Left vertebral artery is patent. There is no visible enhancement of the right vertebral artery until the V3 segment. Skeleton: Multilevel degenerative changes of the cervical spine. Other neck: No neck mass or adenopathy. Upper chest: There is mild interstitial thickening, patchy ground-glass opacities and scattered nodular opacities. There is conglomerate mediastinal adenopathy. Review of the MIP images confirms the above findings CTA HEAD FINDINGS Anterior circulation: Intracranial internal carotid arteries are patent with minor calcified plaque. Middle and anterior cerebral arteries are patent. Posterior circulation: Intracranial vertebral arteries are patent. Basilar artery is patent. Posterior cerebral arteries are patent. Venous sinuses: As permitted by contrast timing, patent. 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 There is reconstitution at the level of the V3 segment. Otherwise, no large vessel occlusion or hemodynamically significant stenosis. Partially imaged abnormal thoracic findings including pulmonary ground-glass and nodular opacities and conglomerate mediastinal adenopathy. Dedicated chest imaging is recommended. These results were communicated to Dr. Leonel Ramsay at 2:25 pmon 12/24/2020by text  page via the [facility] messaging system. Electronically Signed   By: Macy Mis M.D.   On: 04/02/2019 14:30   MR BRAIN WO CONTRAST  Result Date: 04/02/2019 CLINICAL DATA:  Acute onset of right lower extremity weakness beginning 7.5 hours ago. EXAM: MRI HEAD WITHOUT CONTRAST TECHNIQUE: Multiplanar, multiecho pulse sequences of the brain and surrounding structures were obtained without intravenous contrast. COMPARISON:  CTA head and neck 03/03/2019 FINDINGS: Brain: Scattered areas of restricted diffusion are present throughout the left ACA distribution. Many of these are along the ACA/ MCA watershed. There is restricted diffusion involving the anterior genu of the corpus callosum on the left. Single right-sided lesion is present anteriorly in the medial right frontal lobe measuring 6.5 mm. The T2 hyperintense lesion in the left corona radiata does not show restricted diffusion. There is T2 shine through. T2 signal changes are associated with the areas of acute infarction. Periventricular T2 hyperintensities are present separately as well, moderately advanced for age. Vascular: Flow is present in the major intracranial arteries. Skull and upper cervical spine: Degenerative changes are present in the cervical spine. Craniocervical junction is normal. Marrow signal is normal. Sinuses/Orbits: The paranasal sinuses and mastoid air cells are clear. Is some fluid in the mastoid air cells, left greater than right. No obstructing nasopharyngeal lesion is present. Left globe replacement is noted. Cataract surgery is noted on the right. IMPRESSION: 1. Scattered areas of acute/subacute nonhemorrhagic infarction involving the left ACA distribution, ACA MCA watershed, and anterior genu of the corpus callosum on the left. 2. No other acute intracranial abnormality. 3. Atrophy and white matter disease is moderately  advanced for age. This likely reflects the sequela of chronic microvascular ischemia. 4. Bilateral mastoid  effusions, left greater than right. No obstructing nasopharyngeal lesion is present. The above was relayed via text pager to [facility] on 04/02/2019 at 16:11 . Electronically Signed   By: Marin Roberts M.D.   On: 04/02/2019 16:12   DG Chest Portable 1 View  Result Date: 04/02/2019 CLINICAL DATA:  Mediastinal adenopathy and multiple poorly defined pulmonary densities seen on CT scan of the neck dated 04/02/2019. EXAM: PORTABLE CHEST 1 VIEW COMPARISON:  CT scan of the neck dated 04/02/2019 FINDINGS: The heart size and pulmonary vascularity are normal. There is slight fullness in mediastinum but this is much less obvious than on the CT scan where the mediastinal adenopathy is quite prominent. There are faint areas of subtle density in the lung apices and at the right midzone but these are not nearly as a apparent as on the prior CT scan. Slight atelectasis at the lung bases. No effusions. No bone abnormality. IMPRESSION: 1. The mediastinal adenopathy is not as apparent on chest x-ray as on the CT scan. Does the patient have a history of sarcoidosis? CT scan of the chest with contrast may help further delineate the extent of the abnormalities. 2. Subtle areas of density in the lung apices and at the right midzone. 3. Slight atelectasis at the lung bases. Electronically Signed   By: Francene Boyers M.D.   On: 04/02/2019 17:06   CT HEAD CODE STROKE WO CONTRAST  Result Date: 04/02/2019 CLINICAL DATA:  Code stroke.  Right-sided weakness EXAM: CT HEAD WITHOUT CONTRAST TECHNIQUE: Contiguous axial images were obtained from the base of the skull through the vertex without intravenous contrast. COMPARISON:  None. FINDINGS: Brain: There is no acute intracranial hemorrhage, mass effect, or evidence of acute infarction. Gray-white differentiation is preserved. Ventricles and sulci are normal in size and configuration. Patchy hypoattenuation in the supratentorial white matter is nonspecific but may reflect  minor chronic microvascular ischemic changes. There is no extra-axial collection. Vascular: No hyperdense vessel or unexpected calcification. Skull: Unremarkable. Sinuses/Orbits: Mild mucosal thickening.  Left ocular prosthesis. Other: Opacification of the left mastoid tip. ASPECTS [facility] Stroke Program Early CT Score) - Ganglionic level infarction (caudate, lentiform nuclei, internal capsule, insula, M1-M3 cortex): 7 - Supraganglionic infarction (M4-M6 cortex): 3 Total score (0-10 with 10 being normal): 10 IMPRESSION: 1. No acute intracranial hemorrhage or evidence of acute infarction. 2. ASPECTS is 10. 3. Minor chronic microvascular ischemic changes. These results were communicated to Dr. Amada Jupiter at 2:02 pmon 12/24/2020by text page via the [facility] messaging system. Electronically Signed   By: Guadlupe Spanish M.D.   On: 04/02/2019 14:04    Kenwood Rosiak, Solmon Ice, DO 04/03/2019, 8:09 AM PGY-2, [place] [facility] FPTS Intern pager: [phone], text pages welcome

## 2019-04-04 LAB — CARDIOLIPIN ANTIBODIES, IGG, IGM, IGA
Anticardiolipin IgA: <9 APL U/mL (ref 0–11)
Anticardiolipin IgG: <9 GPL U/mL (ref 0–14)
Anticardiolipin IgM: <9 MPL U/mL (ref 0–12)

## 2019-04-04 LAB — RPR: RPR Ser Ql: NONREACTIVE

## 2019-04-04 LAB — SICKLE CELL SCREEN: Sickle Cell Screen: NEGATIVE

## 2019-04-04 LAB — ANGIOTENSIN CONVERTING ENZYME: Angiotensin-Converting Enzyme: 17 U/L (ref 14–82)

## 2019-04-05 LAB — LUPUS ANTICOAGULANT PANEL
DRVVT: 34.1 s (ref 0.0–47.0)
PTT Lupus Anticoagulant: 30.2 s (ref 0.0–51.9)

## 2019-04-06 ENCOUNTER — Encounter: Payer: Self-pay | Admitting: Cardiology

## 2019-04-06 ENCOUNTER — Telehealth: Payer: Self-pay | Admitting: Medical

## 2019-04-06 ENCOUNTER — Other Ambulatory Visit: Payer: Self-pay

## 2019-04-06 ENCOUNTER — Ambulatory Visit (INDEPENDENT_AMBULATORY_CARE_PROVIDER_SITE_OTHER): Payer: Medicare Other | Admitting: Cardiology

## 2019-04-06 VITALS — BP 140/102 | HR 94 | Ht 62.0 in | Wt 159.4 lb

## 2019-04-06 DIAGNOSIS — I10 Essential (primary) hypertension: Secondary | ICD-10-CM

## 2019-04-06 DIAGNOSIS — Z9889 Other specified postprocedural states: Secondary | ICD-10-CM

## 2019-04-06 DIAGNOSIS — I6349 Cerebral infarction due to embolism of other cerebral artery: Secondary | ICD-10-CM | POA: Diagnosis not present

## 2019-04-06 NOTE — Addendum Note (Signed)
Addended by: Mady Haagensen on: 04/06/2019 04:38 PM   Modules accepted: Orders

## 2019-04-06 NOTE — Telephone Encounter (Signed)
   Cardiology notified over the weekend that patient was admitted with a stroke and needed an outpatient TEE. She was not evaluated by cardiology during that admission, therefore will need to be seen outpatient in order to coordinate this testing. She has been scheduled to see Kathyrn Drown, NP 04/06/2019 at 2pm today. Patient was in agreement with the plan.   Abigail Butts, PA-C 04/06/19; 9:59 AM

## 2019-04-06 NOTE — Patient Instructions (Signed)
Medication Instructions:  Your physician recommends that you continue on your current medications as directed. Please refer to the Current Medication list given to you today.  *If you need a refill on your cardiac medications before your next appointment, please call your pharmacy*  Lab Work:  Your physician recommends that you return for lab work on 04/09/19 at 3:00PM for a BMET and CBC  If you have labs (blood work) drawn today and your tests are completely normal, you will receive your results only by: Marland Kitchen MyChart Message (if you have MyChart) OR . A paper copy in the mail If you have any lab test that is abnormal or we need to change your treatment, we will call you to review the results.  Testing/Procedures:  Your Pre-procedure COVID-19 Testing will be done on 04/09/19 at 3:55PM. This is a Drive Up Visit at the [facility] [address], [place]. Someone will direct you to the appropriate testing line. Stay in your car and someone will be with you shortly. After your swab you will be given a mask to wear and instructed to go home and quarantine/no visitors until after your procedure. If you test positive you will be notified and your procedure will be cancelled.    You are scheduled for a TEE on 04/14/19 with Dr. Debara Pickett.  Please arrive at the [facility] (Main Entrance A) at [facility]: [address] [place], [place] [address] at 11:00AM. (1 hour prior to procedure unless lab work is needed; if lab work is needed arrive 1.5 hours ahead)  DIET: Nothing to eat or drink after midnight except a sip of water with medications (see medication instructions below)  Medication Instructions: Hold Victoza  Continue your anticoagulant: Plavix You will need to continue your anticoagulant after your procedure until you are told by your  Provider that it is safe to stop   Labs: For patients receiving anesthesia for TEE and all Cardioversion patients: BMET, CBC within 1  week   You must have a responsible person to drive you home and stay in the waiting area during your procedure. Failure to do so could result in cancellation.  Bring your insurance cards.  *Special Note: Every effort is made to have your procedure done on time. Occasionally there are emergencies that occur at the hospital that may cause delays. Please be patient if a delay does occur.    Follow-Up:  As needed with Thompson Grayer, MD

## 2019-04-06 NOTE — H&P (View-Only) (Signed)
Cardiology Office Note   Date:  04/06/2019   ID:  Camille Dragan, DOB 04/22/1960, MRN [id number]  PCP:  Patient, No Pcp Per  Cardiologist: Dr. Rayann Heman   Chief Complaint  Patient presents with   Pre-op Exam    History of Present Illness: Kathleen George is a 58 y.o. female who presents for OP TEE for the evaluation of CVA.  Kathleen George presented with neurological deficits including right lower extremity weakness with imaging which confirmed cerebrovascular accident in [place] and MCA watershed areas.  Other past medical history includes DM 2, hypertension, hyperlipidemia, glaucoma and prosthetic left eye globe.  During her hospital course, neurology was consulted.  Echocardiogram showed normal LV EF with grade 1 diastolic dysfunction and no evidence of thrombus noted. 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  Recommendations were for high intensity atorvastatin 80 mg daily, ASA 325 mg daily for 3 weeks then plans for discontinuation of Plavix only and continue ASA (81) indefinitely.    OP plans for TEE with cardiology and possibly loop recorder on discharge. Case discussed with Dr. Rayann Heman who has agreed to see the patient as a new patient (DOD). Further work-up per neurology includes transcranial Doppler bubble study for PFO, antiphospholipid antibodies, lupus anticoagulation, ANA panel, ACE levels and ESR.     Today she presents for discussion of TEE. [facility] has been requested to perform a transesophageal echocardiogram on Kathleen George for CVA.  After careful review of history and examination, the risks and benefits of transesophageal echocardiogram have been explained including risks of esophageal damage, perforation (1:10,000 risk), bleeding, pharyngeal hematoma as well as other potential complications associated with conscious sedation including aspiration, arrhythmia, respiratory failure  and death. Alternatives to treatment were discussed, questions were answered. Patient is willing to proceed.   Dr. Rayann Heman will discuss the risks and benefits of loop implantation if he feels this is necessary at the time of his interview.   Today, Kathleen George presents alone. She reports that she is doing well since hospital discharge. She reports that she lives in [place] and will be following with her PCP there. She plans to call them this week.  Also needs follow-up with neurology in 6 weeks as previously planned. I have stressed the importance of these follow-up office visits.  Patient understands and agrees.  She denies recurrent symptoms.  BP is on the higher side today at 140/102 however she has been off of her home antihypertensive since hospital admission.  Discussed close follow-up with PCP to assist in restarting her medications slowly as we do not want to drop her blood pressure too low too fast.  Patient denies chest pain, palpitations, shortness of breath, LE swelling, orthopnea, dizziness, presyncopal or syncopal episodes.  She has no prior history of CAD.      Past Medical History:  Diagnosis Date   Diabetes mellitus without complication (HCC)    Glaucoma    Hypercholesterolemia    Hypertension    Prosthetic eye globe    Stroke [facility])     Past Surgical History:  Procedure Laterality Date   BREAST BIOPSY     left knee surg      Current Outpatient Medications  Medication Sig Dispense Refill   aspirin EC 81 MG EC tablet Take 1 tablet (81 mg total) by mouth daily. 30 tablet 0   atorvastatin (LIPITOR) 80 MG tablet Take 1 tablet (80 mg total) by  mouth daily at 6 PM. 30 tablet 0   clopidogrel (PLAVIX) 75 MG tablet Take 1 tablet (75 mg total) by mouth daily for 21 days. 21 tablet 0   dorzolamide-timolol (COSOPT) 22.3-6.8 MG/ML ophthalmic solution Place 1 drop into the right eye 2 (two) times daily.     erythromycin ophthalmic ointment Place 1 application into the left eye at  bedtime.     liraglutide (VICTOZA) 18 MG/3ML SOPN Inject 1.2 mg into the skin every evening.     No current facility-administered medications for this visit.    Allergies:   Penicillins    Social History:  The patient  reports that she has quit smoking. She has never used smokeless tobacco. She reports previous alcohol use. She reports that she does not use drugs.   Family History:  The patient's family history includes Hypertension in her father and mother.    ROS:  Please see the history of present illness. Otherwise, review of systems are positive for none.   All other systems are reviewed and negative.    PHYSICAL EXAM: VS:  There were no vitals taken for this visit. , BMI There is no height or weight on file to calculate BMI.   General: Well developed, well nourished, NAD Neck: Negative for carotid bruits. No JVD Lungs:Clear to ausculation bilaterally. Breathing is unlabored. Cardiovascular: RRR with S1 S2. No murmurs Extremities: No edema. No clubbing or cyanosis. DP pulses 2+ bilaterally Neuro: Alert and oriented.  Psych: Responds to questions appropriately with normal affect.     EKG:  EKG is not ordered today. The ekg ordered today demonstrates NSR with HR 94bpm   Recent Labs: 04/02/2019: ALT 32 04/03/2019: BUN 17; Creatinine, Ser 0.70; Hemoglobin 13.7; Platelets 382; Potassium 3.8; Sodium 139    Lipid Panel    Component Value Date/Time   CHOL 178 04/03/2019 0300   TRIG 384 (H) 04/03/2019 0300   HDL 25 (L) 04/03/2019 0300   CHOLHDL 7.1 04/03/2019 0300   VLDL 77 (H) 04/03/2019 0300   LDLCALC 76 04/03/2019 0300     Wt Readings from Last 3 Encounters:  04/02/19 130 lb (59 kg)     Other studies Reviewed: Additional studies/ records that were reviewed today include:   Echocardiogram 04/03/2019:    1. Left ventricular ejection fraction, by visual estimation, is 60 to 65%. The left ventricle has normal function. There is mildly increased left ventricular  hypertrophy.  2. Elevated left ventricular end-diastolic pressure.  3. Left ventricular diastolic parameters are consistent with Grade I diastolic dysfunction (impaired relaxation).  4. The left ventricle has no regional wall motion abnormalities.  5. Global right ventricle has mildly reduced systolic function.The right ventricular size is normal. No increase in right ventricular wall thickness.  6. Left atrial size was normal.  7. Right atrial size was normal.  8. The mitral valve is grossly normal. Trivial mitral valve regurgitation.  9. The tricuspid valve is grossly normal. 10. The aortic valve is tricuspid. Aortic valve regurgitation is not visualized. No evidence of aortic valve sclerosis or stenosis. 11. The pulmonic valve was grossly normal. Pulmonic valve regurgitation is not visualized. 12. Normal pulmonary artery systolic pressure.  ASSESSMENT AND PLAN:  1.  Recent CVA with need for TEE: -Per discharge, plan for OP TEE given recent CVA in embolic left ACA and MCA in watershed areas with unknown source.  -Per neurology, plans for ASA and Plavix for 3 weeks then discontinue Plavix with indefinite continuation of ASA>>reviewed with patient  -  Continue high intensity statin, atorvastatin 80 mg p.o. daily -Preprocedure EKG with NSR an no acute changes  -Needs Covid screen prior to procedure -Plan for follow up with neurology in 6 weeks  -Lab work today to include BMET, CBC  2.  Hypertension: -Permissive hypertension per neurology -Plans to hold antihypertensive medications which include metoprolol, lisinopril and amlodipine for 5 to 7 days post discharge per neurology/primary team -Discussed adding medications back slowly>>will need close follow up with PCP for direction.    Current medicines are reviewed at length with the patient today.  The patient does not have concerns regarding medicines.  The following changes have been made:  no change  Labs/ tests ordered today  include: TEE/loop implantation/pre procedure COVID screen  No orders of the defined types were placed in this encounter.   Disposition:   FU with Dr. Rayann Heman PRN   Signed, Kathyrn Drown, NP  04/06/2019 2:20 PM    [address], [place], [place]  [address] Phone: [phone]; Fax: [phone]    I have seen, examined the patient, and reviewed the above assessment and plan.  Changes to above are made where necessary.  On exam, RRR.  The patient has a history of embolic stroke of unknown cause.  We will plan to proceed with TEE and subsequent ILR at the next available time.  Co Sign: Thompson Grayer, MD 04/07/2019 10:09 PM

## 2019-04-06 NOTE — Progress Notes (Addendum)
   Cardiology Office Note   Date:  04/06/2019   ID:  Kathleen George, DOB 03/25/1961, MRN [id number]  PCP:  Patient, No Pcp Per  Cardiologist: Dr. Allred   Chief Complaint  Patient presents with   Pre-op Exam    History of Present Illness: Kathleen George is a 58 y.o. female who presents for OP TEE for the evaluation of CVA.  Kathleen George presented with neurological deficits including right lower extremity weakness with imaging which confirmed cerebrovascular accident in ACA and MCA watershed areas.  Other past medical history includes DM 2, hypertension, hyperlipidemia, glaucoma and prosthetic left eye globe.  During her hospital course, neurology was consulted.  Echocardiogram showed normal LV EF with grade 1 diastolic dysfunction and no evidence of thrombus noted. 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  Recommendations were for high intensity atorvastatin 80 mg daily, ASA 325 mg daily for 3 weeks then plans for discontinuation of Plavix only and continue ASA (81) indefinitely.    OP plans for TEE with cardiology and possibly loop recorder on discharge. Case discussed with Dr. Allred who has agreed to see the patient as a new patient (Kathleen George). Further work-up per neurology includes transcranial Doppler bubble study for PFO, antiphospholipid antibodies, lupus anticoagulation, ANA panel, ACE levels and ESR.     Today she presents for discussion of TEE. [facility] has been requested to perform a transesophageal echocardiogram on Kathleen George for CVA.  After careful review of history and examination, the risks and benefits of transesophageal echocardiogram have been explained including risks of esophageal damage, perforation (1:10,000 risk), bleeding, pharyngeal hematoma as well as other potential complications associated with conscious sedation including aspiration, arrhythmia, respiratory failure  and death. Alternatives to treatment were discussed, questions were answered. Patient is willing to proceed.   Dr. Allred will discuss the risks and benefits of loop implantation if he feels this is necessary at the time of his interview.   Today, Kathleen George presents alone. She reports that she is doing well since hospital discharge. She reports that she lives in [place] and will be following with her PCP there. She plans to call them this week.  Also needs follow-up with neurology in 6 weeks as previously planned. I have stressed the importance of these follow-up office visits.  Patient understands and agrees.  She denies recurrent symptoms.  BP is on the higher side today at 140/102 however she has been off of her home antihypertensive since hospital admission.  Discussed close follow-up with PCP to assist in restarting her medications slowly as we do not want to drop her blood pressure too low too fast.  Patient denies chest pain, palpitations, shortness of breath, LE swelling, orthopnea, dizziness, presyncopal or syncopal episodes.  She has no prior history of CAD.      Past Medical History:  Diagnosis Date   Diabetes mellitus without complication (HCC)    Glaucoma    Hypercholesterolemia    Hypertension    Prosthetic eye globe    Stroke (HCC)     Past Surgical History:  Procedure Laterality Date   BREAST BIOPSY     left knee surg      Current Outpatient Medications  Medication Sig Dispense Refill   aspirin EC 81 MG EC tablet Take 1 tablet (81 mg total) by mouth daily. 30 tablet 0   atorvastatin (LIPITOR) 80 MG tablet Take 1 tablet (80 mg total) by   mouth daily at 6 PM. 30 tablet 0   clopidogrel (PLAVIX) 75 MG tablet Take 1 tablet (75 mg total) by mouth daily for 21 days. 21 tablet 0   dorzolamide-timolol (COSOPT) 22.3-6.8 MG/ML ophthalmic solution Place 1 drop into the right eye 2 (two) times daily.     erythromycin ophthalmic ointment Place 1 application into the left eye at  bedtime.     liraglutide (VICTOZA) 18 MG/3ML SOPN Inject 1.2 mg into the skin every evening.     No current facility-administered medications for this visit.    Allergies:   Penicillins    Social History:  The patient  reports that she has quit smoking. She has never used smokeless tobacco. She reports previous alcohol use. She reports that she does not use drugs.   Family History:  The patient's family history includes Hypertension in her father and mother.    ROS:  Please see the history of present illness. Otherwise, review of systems are positive for none.   All other systems are reviewed and negative.    PHYSICAL EXAM: VS:  There were no vitals taken for this visit. , BMI There is no height or weight on file to calculate BMI.   General: Well developed, well nourished, NAD Neck: Negative for carotid bruits. No JVD Lungs:Clear to ausculation bilaterally. Breathing is unlabored. Cardiovascular: RRR with S1 S2. No murmurs Extremities: No edema. No clubbing or cyanosis. DP pulses 2+ bilaterally Neuro: Alert and oriented.  Psych: Responds to questions appropriately with normal affect.     EKG:  EKG is not ordered today. The ekg ordered today demonstrates NSR with HR 94bpm   Recent Labs: 04/02/2019: ALT 32 04/03/2019: BUN 17; Creatinine, Ser 0.70; Hemoglobin 13.7; Platelets 382; Potassium 3.8; Sodium 139    Lipid Panel    Component Value Date/Time   CHOL 178 04/03/2019 0300   TRIG 384 (H) 04/03/2019 0300   HDL 25 (L) 04/03/2019 0300   CHOLHDL 7.1 04/03/2019 0300   VLDL 77 (H) 04/03/2019 0300   LDLCALC 76 04/03/2019 0300     Wt Readings from Last 3 Encounters:  04/02/19 130 lb (59 kg)     Other studies Reviewed: Additional studies/ records that were reviewed today include:   Echocardiogram 04/03/2019:    1. Left ventricular ejection fraction, by visual estimation, is 60 to 65%. The left ventricle has normal function. There is mildly increased left ventricular  hypertrophy.  2. Elevated left ventricular end-diastolic pressure.  3. Left ventricular diastolic parameters are consistent with Grade I diastolic dysfunction (impaired relaxation).  4. The left ventricle has no regional wall motion abnormalities.  5. Global right ventricle has mildly reduced systolic function.The right ventricular size is normal. No increase in right ventricular wall thickness.  6. Left atrial size was normal.  7. Right atrial size was normal.  8. The mitral valve is grossly normal. Trivial mitral valve regurgitation.  9. The tricuspid valve is grossly normal. 10. The aortic valve is tricuspid. Aortic valve regurgitation is not visualized. No evidence of aortic valve sclerosis or stenosis. 11. The pulmonic valve was grossly normal. Pulmonic valve regurgitation is not visualized. 12. Normal pulmonary artery systolic pressure.  ASSESSMENT AND PLAN:  1.  Recent CVA with need for TEE: -Per discharge, plan for OP TEE given recent CVA in embolic left ACA and MCA in watershed areas with unknown source.  -Per neurology, plans for ASA and Plavix for 3 weeks then discontinue Plavix with indefinite continuation of ASA>>reviewed with patient  -  Continue high intensity statin, atorvastatin 80 mg p.o. daily -Preprocedure EKG with NSR an no acute changes  -Needs Covid screen prior to procedure -Plan for follow up with neurology in 6 weeks  -Lab work today to include BMET, CBC  2.  Hypertension: -Permissive hypertension per neurology -Plans to hold antihypertensive medications which include metoprolol, lisinopril and amlodipine for 5 to 7 days post discharge per neurology/primary team -Discussed adding medications back slowly>>will need close follow up with PCP for direction.    Current medicines are reviewed at length with the patient today.  The patient does not have concerns regarding medicines.  The following changes have been made:  no change  Labs/ tests ordered today  include: TEE/loop implantation/pre procedure COVID screen  No orders of the defined types were placed in this encounter.   Disposition:   FU with Dr. Allred PRN   Signed, Orelia Brandstetter, NP  04/06/2019 2:20 PM    [facility] [address], [place], [place]  [address] Phone: [phone]; Fax: [phone]    I have seen, examined the patient, and reviewed the above assessment and plan.  Changes to above are made where necessary.  On exam, RRR.  The patient has a history of embolic stroke of unknown cause.  We will plan to proceed with TEE and subsequent ILR at the next available time.  Co Sign: James Allred, MD 04/07/2019 10:09 PM  

## 2019-04-08 LAB — ANTINUCLEAR ANTIBODIES, IFA: ANA Ab, IFA: NEGATIVE

## 2019-04-09 ENCOUNTER — Inpatient Hospital Stay (HOSPITAL_COMMUNITY): Admission: RE | Admit: 2019-04-09 | Payer: Medicare Other | Source: Ambulatory Visit

## 2019-04-09 ENCOUNTER — Other Ambulatory Visit: Payer: Medicare Other

## 2019-04-11 ENCOUNTER — Other Ambulatory Visit (HOSPITAL_COMMUNITY)
Admission: RE | Admit: 2019-04-11 | Discharge: 2019-04-11 | Disposition: A | Discharge status: Home / Self Care | Payer: Medicare Other | Source: Ambulatory Visit | Attending: Internal Medicine | Admitting: Internal Medicine

## 2019-04-11 DIAGNOSIS — Z20822 Contact with and (suspected) exposure to covid-19: Secondary | ICD-10-CM | POA: Diagnosis not present

## 2019-04-11 DIAGNOSIS — Z01812 Encounter for preprocedural laboratory examination: Secondary | ICD-10-CM | POA: Insufficient documentation

## 2019-04-11 LAB — SARS CORONAVIRUS 2 (TAT 6-24 HRS): SARS Coronavirus 2: NEGATIVE

## 2019-04-13 ENCOUNTER — Other Ambulatory Visit: Payer: Self-pay | Admitting: Cardiology

## 2019-04-13 DIAGNOSIS — I6349 Cerebral infarction due to embolism of other cerebral artery: Secondary | ICD-10-CM

## 2019-04-14 ENCOUNTER — Encounter (HOSPITAL_COMMUNITY)
Admission: RE | Disposition: A | Discharge status: Home / Self Care | Payer: Self-pay | Source: Home / Self Care | Attending: Internal Medicine

## 2019-04-14 ENCOUNTER — Ambulatory Visit (HOSPITAL_COMMUNITY)
Admission: RE | Admit: 2019-04-14 | Discharge: 2019-04-14 | Disposition: A | Discharge status: Home / Self Care | Payer: Medicare Other | Attending: Internal Medicine | Admitting: Internal Medicine

## 2019-04-14 ENCOUNTER — Encounter (HOSPITAL_COMMUNITY): Payer: Self-pay | Admitting: Internal Medicine

## 2019-04-14 ENCOUNTER — Ambulatory Visit (HOSPITAL_BASED_OUTPATIENT_CLINIC_OR_DEPARTMENT_OTHER)
Admission: RE | Admit: 2019-04-14 | Discharge: 2019-04-14 | Disposition: A | Discharge status: Home / Self Care | Payer: Medicare Other | Source: Home / Self Care | Attending: Internal Medicine | Admitting: Internal Medicine

## 2019-04-14 ENCOUNTER — Encounter (HOSPITAL_COMMUNITY)
Admission: RE | Disposition: A | Discharge status: Home / Self Care | Payer: Medicare Other | Source: Home / Self Care | Attending: Internal Medicine

## 2019-04-14 ENCOUNTER — Other Ambulatory Visit: Payer: Self-pay

## 2019-04-14 DIAGNOSIS — I639 Cerebral infarction, unspecified: Secondary | ICD-10-CM | POA: Diagnosis not present

## 2019-04-14 DIAGNOSIS — E7800 Pure hypercholesterolemia, unspecified: Secondary | ICD-10-CM | POA: Insufficient documentation

## 2019-04-14 DIAGNOSIS — Z79899 Other long term (current) drug therapy: Secondary | ICD-10-CM | POA: Insufficient documentation

## 2019-04-14 DIAGNOSIS — Z7982 Long term (current) use of aspirin: Secondary | ICD-10-CM | POA: Insufficient documentation

## 2019-04-14 DIAGNOSIS — E119 Type 2 diabetes mellitus without complications: Secondary | ICD-10-CM | POA: Insufficient documentation

## 2019-04-14 DIAGNOSIS — Z7902 Long term (current) use of antithrombotics/antiplatelets: Secondary | ICD-10-CM | POA: Insufficient documentation

## 2019-04-14 DIAGNOSIS — Z794 Long term (current) use of insulin: Secondary | ICD-10-CM | POA: Diagnosis not present

## 2019-04-14 DIAGNOSIS — I6389 Other cerebral infarction: Secondary | ICD-10-CM

## 2019-04-14 DIAGNOSIS — I10 Essential (primary) hypertension: Secondary | ICD-10-CM | POA: Diagnosis not present

## 2019-04-14 DIAGNOSIS — E785 Hyperlipidemia, unspecified: Secondary | ICD-10-CM | POA: Diagnosis not present

## 2019-04-14 DIAGNOSIS — Z87891 Personal history of nicotine dependence: Secondary | ICD-10-CM | POA: Diagnosis not present

## 2019-04-14 DIAGNOSIS — Z880 Allergy status to penicillin: Secondary | ICD-10-CM | POA: Insufficient documentation

## 2019-04-14 HISTORY — PX: BUBBLE STUDY: SHX6837

## 2019-04-14 HISTORY — PX: TEE WITHOUT CARDIOVERSION: SHX5443

## 2019-04-14 HISTORY — PX: LOOP RECORDER INSERTION: EP1214

## 2019-04-14 LAB — GLUCOSE, CAPILLARY: Glucose-Capillary: 110 mg/dL — ABNORMAL HIGH (ref 70–99)

## 2019-04-14 SURGERY — LOOP RECORDER INSERTION

## 2019-04-14 SURGERY — ECHOCARDIOGRAM, TRANSESOPHAGEAL
Anesthesia: Moderate Sedation

## 2019-04-14 MED ORDER — FENTANYL CITRATE (PF) 100 MCG/2ML IJ SOLN
INTRAMUSCULAR | Status: AC
  Filled 2019-04-14: qty 4

## 2019-04-14 MED ORDER — SODIUM CHLORIDE 0.9 % IV SOLN: INTRAVENOUS | Status: DC

## 2019-04-14 MED ORDER — LIDOCAINE-EPINEPHRINE 1 %-1:100000 IJ SOLN
INTRAMUSCULAR | Status: DC | PRN
  Administered 2019-04-14: 20 mL

## 2019-04-14 MED ORDER — FENTANYL CITRATE (PF) 100 MCG/2ML IJ SOLN
INTRAMUSCULAR | Status: DC | PRN
  Administered 2019-04-14 (×3): 25 ug via INTRAVENOUS

## 2019-04-14 MED ORDER — BUTAMBEN-TETRACAINE-BENZOCAINE 2-2-14 % EX AERO
INHALATION_SPRAY | CUTANEOUS | Status: DC | PRN
  Administered 2019-04-14: 12:00:00 2 via TOPICAL

## 2019-04-14 MED ORDER — MIDAZOLAM HCL (PF) 5 MG/ML IJ SOLN
INTRAMUSCULAR | Status: AC
  Filled 2019-04-14: qty 2

## 2019-04-14 MED ORDER — LIDOCAINE-EPINEPHRINE 1 %-1:100000 IJ SOLN
INTRAMUSCULAR | Status: AC
  Filled 2019-04-14: qty 1

## 2019-04-14 MED ORDER — MIDAZOLAM HCL (PF) 10 MG/2ML IJ SOLN
INTRAMUSCULAR | Status: DC | PRN
  Administered 2019-04-14: 2 mg via INTRAVENOUS
  Administered 2019-04-14: 1 mg via INTRAVENOUS
  Administered 2019-04-14: 2 mg via INTRAVENOUS

## 2019-04-14 SURGICAL SUPPLY — 2 items
MONITOR REVEAL LINQ II (Prosthesis & Implant Heart) ×2 IMPLANT
PACK LOOP INSERTION (CUSTOM PROCEDURE TRAY) ×2 IMPLANT

## 2019-04-14 NOTE — Interval H&P Note (Signed)
History and Physical Interval Note:  04/14/2019 11:56 AM  Kathleen George  has presented today for surgery, with the diagnosis of stroke.  The various methods of treatment have been discussed with the patient and family. After consideration of risks, benefits and other options for treatment, the patient has consented to  Procedure(s): LOOP RECORDER INSERTION (N/A) as a surgical intervention.  The patient's history has been reviewed, patient examined, no change in status, stable for surgery.  I have reviewed the patient's chart and labs.  Questions were answered to the patient's satisfaction.    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  The patient has a TEE planned for this AM.  I spoke at length with the patient about monitoring for afib with either a 30 day event monitor or an implantable loop recorder.  Risks, benefits, and alteratives to TEE as well as implantable loop recorder were discussed with the patient today.   At this time, the patient is very clear in their decision to proceed at this time.   Please call with questions.   Hillis Range

## 2019-04-14 NOTE — H&P (Signed)
   INTERVAL PROCEDURE H&P  History and Physical Interval Note:  04/14/2019 12:04 PM  Kathleen George has presented today for their planned procedure. The various methods of treatment have been discussed with the patient and family. After consideration of risks, benefits and other options for treatment, the patient has consented to the procedure.  The patients' outpatient history has been reviewed, patient examined, and no change in status from most recent office note within the past 30 days. I have reviewed the patients' chart and labs and will proceed as planned. Questions were answered to the patient's satisfaction.   Chrystie Nose, MD, [facility], FACP  [place]  [facility]  Medical Director of the [facility] Lipid [facility]  Cardiovascular Risk Reduction Clinic Diplomate of the American Board of Clinical Lipidology Attending Cardiologist  Direct Dial: [phone]  Fax: [phone]  Website:  [email or web address] Nicely Asuna Peth 04/14/2019, 12:04 PM

## 2019-04-14 NOTE — Progress Notes (Signed)
  Echocardiogram Echocardiogram Transesophageal has been performed.  Adine Madura 04/14/2019, 12:46 PM

## 2019-04-14 NOTE — CV Procedure (Signed)
TRANSESOPHAGEAL ECHOCARDIOGRAM (TEE) NOTE  INDICATIONS: cryptogenic stroke  PROCEDURE:   Informed consent was obtained prior to the procedure. The risks, benefits and alternatives for the procedure were discussed and the patient comprehended these risks.  Risks include, but are not limited to, cough, sore throat, vomiting, nausea, somnolence, esophageal and stomach trauma or perforation, bleeding, low blood pressure, aspiration, pneumonia, infection, trauma to the teeth and death.    After a procedural time-out, the patient was given 5 mg versed and 75 mcg fentanyl for moderate sedation.  The patient's heart rate, blood pressure, and oxygen saturation are monitored continuously during the procedure.The oropharynx was anesthetized 20 cc of topical 1% viscous lidocaine and 2 cetacaine sprays.  The transesophageal probe was inserted in the esophagus and stomach without difficulty and multiple views were obtained.  The patient was kept under observation until the patient left the procedure room.  The period of conscious sedation is 12 minutes, of which I was present face-to-face 100% of this time. The patient left the procedure room in stable condition.   Agitated microbubble saline contrast was administered.  COMPLICATIONS:    There were no immediate complications.  Findings:  1. LEFT VENTRICLE: The left ventricular wall thickness is mildly increased.  The left ventricular cavity is normal in size. Wall motion is normal.  LVEF is 60-65%.  2. RIGHT VENTRICLE:  The right ventricle is normal in structure and function without any thrombus or masses.    3. LEFT ATRIUM:  The left atrium is normal in size without any thrombus or masses.  There is not spontaneous echo contrast ("smoke") in the left atrium consistent with a low flow state.  4. LEFT ATRIAL APPENDAGE:  The left atrial appendage is free of any thrombus or masses. The appendage has single lobes. Pulse doppler indicates moderate flow in  the appendage.  5. ATRIAL SEPTUM:  The atrial septum appears intact and is free of thrombus and/or masses.  There is no evidence for interatrial shunting by color doppler and saline microbubble.  6. RIGHT ATRIUM:  The right atrium is normal in size and function without any thrombus or masses.  7. MITRAL VALVE:  The mitral valve is normal in structure and function with trivial regurgitation.  There were no vegetations or stenosis.  8. AORTIC VALVE:  The aortic valve is trileaflet, normal in structure and function with no regurgitation.  There were no vegetations or stenosis  9. TRICUSPID VALVE:  The tricuspid valve is normal in structure and function with Mild regurgitation.  There were no vegetations or stenosis  10.  PULMONIC VALVE:  The pulmonic valve is normal in structure and function with no regurgitation.  There were no vegetations or stenosis.   11. AORTIC ARCH, ASCENDING AND DESCENDING AORTA:  There was no Myrtis Ser et. Al, 1992) atherosclerosis of the ascending aorta, aortic arch, or proximal descending aorta.  12. PULMONARY VEINS: Anomalous pulmonary venous return was not noted.  13. PERICARDIUM: The pericardium appeared normal and non-thickened.  There is no pericardial effusion.  IMPRESSION:   1. No LAA thrombus 2. Negative for PFO by color doppler and saline microbubble contrast 3. Trivial MR, mild TR 4. LVEF 60-65%, mild LVH  RECOMMENDATIONS:    1. No cardiac source of stroke identified. Further work-up of cryptogenic stroke with planned ILR.  Time Spent Directly with the Patient:  45 minutes   Chrystie Nose, MD, [facility], FACP  [place]  [facility]  Medical Director of the [facility] Lipid Disorders &  Cardiovascular Risk Reduction Clinic Diplomate of the American Board of Clinical Lipidology Attending Cardiologist  Direct Dial: [phone]  Fax: [phone]  Website:  [email or web address] 04/14/2019, 12:44 PM

## 2019-04-14 NOTE — Discharge Instructions (Signed)

## 2019-05-19 ENCOUNTER — Ambulatory Visit (INDEPENDENT_AMBULATORY_CARE_PROVIDER_SITE_OTHER): Payer: Medicare Other | Admitting: *Deleted

## 2019-05-19 DIAGNOSIS — I639 Cerebral infarction, unspecified: Secondary | ICD-10-CM | POA: Diagnosis not present

## 2019-05-20 LAB — CUP PACEART REMOTE DEVICE CHECK
Date Time Interrogation Session: 20210209142440
Implantable Pulse Generator Implant Date: 20210105

## 2019-05-27 DIAGNOSIS — E7849 Other hyperlipidemia: Secondary | ICD-10-CM | POA: Insufficient documentation

## 2019-05-27 DIAGNOSIS — E119 Type 2 diabetes mellitus without complications: Secondary | ICD-10-CM | POA: Insufficient documentation

## 2019-05-27 DIAGNOSIS — I10 Essential (primary) hypertension: Secondary | ICD-10-CM | POA: Insufficient documentation

## 2019-05-28 ENCOUNTER — Telehealth: Payer: Self-pay | Admitting: Internal Medicine

## 2019-05-28 MED ORDER — MELATONIN 3 MG PO TABS
3.00 | ORAL_TABLET | ORAL | Status: DC
Start: ? — End: 2019-05-28

## 2019-05-28 MED ORDER — LABETALOL HCL 5 MG/ML IV SOLN
10.00 | INTRAVENOUS | Status: DC
Start: ? — End: 2019-05-28

## 2019-05-28 MED ORDER — GENERIC EXTERNAL MEDICATION
Status: DC
Start: ? — End: 2019-05-28

## 2019-05-28 MED ORDER — DEXTROSE 50 % IV SOLN
25.00 | INTRAVENOUS | Status: DC
Start: ? — End: 2019-05-28

## 2019-05-28 MED ORDER — INSULIN REGULAR HUMAN 100 UNIT/ML IJ SOLN
0.00 | INTRAMUSCULAR | Status: DC
Start: 2019-06-01 — End: 2019-05-28

## 2019-05-28 MED ORDER — POLYETHYLENE GLYCOL 3350 17 GM/SCOOP PO POWD
17.00 | ORAL | Status: DC
Start: ? — End: 2019-05-28

## 2019-05-28 MED ORDER — ATORVASTATIN CALCIUM 40 MG PO TABS
40.00 | ORAL_TABLET | ORAL | Status: DC
Start: 2019-06-02 — End: 2019-05-28

## 2019-05-28 MED ORDER — ONDANSETRON HCL 4 MG/2ML IJ SOLN
4.00 | INTRAMUSCULAR | Status: DC
Start: ? — End: 2019-05-28

## 2019-05-28 NOTE — Telephone Encounter (Signed)
Looks like pt was seen by Dr. Johney Frame while in the hospital.

## 2019-05-28 NOTE — Telephone Encounter (Signed)
New Message   Lahoma Rocker with [facility] is calling to advise that the patient is about to have a MRI. He wants [place] to know that some of the data today may not read correct because she is having a MRI

## 2019-05-28 NOTE — Telephone Encounter (Signed)
Noted. She has a loop recorder for cryptogenic stroke.   Thank you!  Casimiro Needle [address]" [place], PA-C  05/28/2019 12:05 PM

## 2019-06-03 MED ORDER — INFLUENZA VAC SPLIT QUAD 0.5 ML IM SUSY
0.50 | PREFILLED_SYRINGE | INTRAMUSCULAR | Status: DC
Start: ? — End: 2019-06-03

## 2019-06-03 MED ORDER — GENERIC EXTERNAL MEDICATION
Status: DC
Start: ? — End: 2019-06-03

## 2019-06-03 MED ORDER — ENOXAPARIN SODIUM 40 MG/0.4ML ~~LOC~~ SOLN
40.00 | SUBCUTANEOUS | Status: DC
Start: 2019-06-02 — End: 2019-06-03

## 2019-06-03 MED ORDER — LORAZEPAM 2 MG/ML IJ SOLN
1.00 | INTRAMUSCULAR | Status: DC
Start: ? — End: 2019-06-03

## 2019-06-03 MED ORDER — IOPAMIDOL (ISOVUE-370) INJECTION 76%
100.00 | INTRAVENOUS | Status: DC
Start: ? — End: 2019-06-03

## 2019-06-03 MED ORDER — METOPROLOL TARTRATE 25 MG PO TABS
25.00 | ORAL_TABLET | ORAL | Status: DC
Start: 2019-06-01 — End: 2019-06-03

## 2019-06-19 ENCOUNTER — Ambulatory Visit (INDEPENDENT_AMBULATORY_CARE_PROVIDER_SITE_OTHER): Payer: Medicare Other | Admitting: *Deleted

## 2019-06-19 DIAGNOSIS — I639 Cerebral infarction, unspecified: Secondary | ICD-10-CM

## 2019-06-20 LAB — CUP PACEART REMOTE DEVICE CHECK
Date Time Interrogation Session: 20210312142456
Implantable Pulse Generator Implant Date: 20210105

## 2019-07-20 ENCOUNTER — Ambulatory Visit (INDEPENDENT_AMBULATORY_CARE_PROVIDER_SITE_OTHER): Payer: Medicare Other | Admitting: *Deleted

## 2019-07-20 DIAGNOSIS — I639 Cerebral infarction, unspecified: Secondary | ICD-10-CM

## 2019-07-20 LAB — CUP PACEART REMOTE DEVICE CHECK
Date Time Interrogation Session: 20210412142207
Implantable Pulse Generator Implant Date: 20210105

## 2019-07-21 NOTE — Progress Notes (Signed)
ILR Remote 

## 2019-09-28 ENCOUNTER — Ambulatory Visit (INDEPENDENT_AMBULATORY_CARE_PROVIDER_SITE_OTHER): Payer: Medicare (Managed Care) | Admitting: *Deleted

## 2019-09-28 DIAGNOSIS — I639 Cerebral infarction, unspecified: Secondary | ICD-10-CM

## 2019-09-28 LAB — CUP PACEART REMOTE DEVICE CHECK
Date Time Interrogation Session: 20210620230505
Implantable Pulse Generator Implant Date: 20210105

## 2019-09-28 NOTE — Progress Notes (Signed)
Carelink Summary Report / Loop Recorder 

## 2020-01-11 ENCOUNTER — Other Ambulatory Visit: Payer: Self-pay

## 2020-03-24 ENCOUNTER — Encounter (HOSPITAL_COMMUNITY): Payer: Medicare (Managed Care)

## 2020-03-29 ENCOUNTER — Other Ambulatory Visit (HOSPITAL_COMMUNITY): Payer: Self-pay | Admitting: Internal Medicine

## 2020-03-29 ENCOUNTER — Encounter (HOSPITAL_COMMUNITY): Payer: Medicare (Managed Care)

## 2020-03-29 ENCOUNTER — Inpatient Hospital Stay (HOSPITAL_COMMUNITY): Admission: RE | Admit: 2020-03-29 | Payer: Medicare (Managed Care) | Source: Ambulatory Visit

## 2020-03-29 DIAGNOSIS — M79606 Pain in leg, unspecified: Secondary | ICD-10-CM

## 2020-04-18 ENCOUNTER — Other Ambulatory Visit: Payer: Self-pay

## 2020-04-18 ENCOUNTER — Ambulatory Visit (HOSPITAL_COMMUNITY)
Admission: RE | Admit: 2020-04-18 | Discharge: 2020-04-18 | Disposition: A | Discharge status: Home / Self Care | Payer: Medicare (Managed Care) | Source: Ambulatory Visit | Attending: Internal Medicine | Admitting: Internal Medicine

## 2020-04-18 ENCOUNTER — Other Ambulatory Visit (HOSPITAL_COMMUNITY): Payer: Self-pay | Admitting: Internal Medicine

## 2020-04-18 DIAGNOSIS — I739 Peripheral vascular disease, unspecified: Secondary | ICD-10-CM

## 2020-05-18 ENCOUNTER — Ambulatory Visit (INDEPENDENT_AMBULATORY_CARE_PROVIDER_SITE_OTHER): Payer: Medicare (Managed Care)

## 2020-05-18 DIAGNOSIS — I639 Cerebral infarction, unspecified: Secondary | ICD-10-CM | POA: Diagnosis not present

## 2020-05-18 LAB — CUP PACEART REMOTE DEVICE CHECK
Date Time Interrogation Session: 20220208230634
Implantable Pulse Generator Implant Date: 20210105

## 2020-05-23 NOTE — Progress Notes (Signed)
Carelink Summary Report / Loop Recorder 

## 2020-06-20 ENCOUNTER — Ambulatory Visit (INDEPENDENT_AMBULATORY_CARE_PROVIDER_SITE_OTHER): Payer: Medicare (Managed Care)

## 2020-06-20 DIAGNOSIS — I639 Cerebral infarction, unspecified: Secondary | ICD-10-CM | POA: Diagnosis not present

## 2020-06-20 LAB — CUP PACEART REMOTE DEVICE CHECK
Date Time Interrogation Session: 20220313230501
Implantable Pulse Generator Implant Date: 20210105

## 2020-06-27 NOTE — Progress Notes (Signed)
Carelink Summary Report / Loop Recorder 

## 2020-07-21 ENCOUNTER — Ambulatory Visit (INDEPENDENT_AMBULATORY_CARE_PROVIDER_SITE_OTHER): Payer: Medicare (Managed Care)

## 2020-07-21 DIAGNOSIS — I639 Cerebral infarction, unspecified: Secondary | ICD-10-CM

## 2020-07-25 LAB — CUP PACEART REMOTE DEVICE CHECK
Date Time Interrogation Session: 20220415230121
Implantable Pulse Generator Implant Date: 20210105

## 2020-08-04 NOTE — Progress Notes (Signed)
Carelink Summary Report / Loop Recorder 

## 2020-08-19 ENCOUNTER — Other Ambulatory Visit: Payer: Self-pay | Admitting: Family Medicine

## 2020-08-19 DIAGNOSIS — Z1231 Encounter for screening mammogram for malignant neoplasm of breast: Secondary | ICD-10-CM

## 2020-09-27 ENCOUNTER — Ambulatory Visit (INDEPENDENT_AMBULATORY_CARE_PROVIDER_SITE_OTHER): Payer: Medicare (Managed Care)

## 2020-09-27 DIAGNOSIS — I639 Cerebral infarction, unspecified: Secondary | ICD-10-CM

## 2020-09-27 LAB — CUP PACEART REMOTE DEVICE CHECK
Date Time Interrogation Session: 20220620230736
Implantable Pulse Generator Implant Date: 20210105

## 2020-10-17 NOTE — Progress Notes (Signed)
Carelink Summary Report / Loop Recorder 

## 2020-10-28 ENCOUNTER — Ambulatory Visit: Payer: Medicare (Managed Care)

## 2020-10-31 LAB — CUP PACEART REMOTE DEVICE CHECK
Date Time Interrogation Session: 20220723230434
Implantable Pulse Generator Implant Date: 20210105

## 2020-11-03 ENCOUNTER — Other Ambulatory Visit: Payer: Self-pay

## 2020-11-03 ENCOUNTER — Ambulatory Visit
Admission: RE | Admit: 2020-11-03 | Discharge: 2020-11-03 | Disposition: A | Discharge status: Home / Self Care | Payer: Medicare (Managed Care) | Source: Ambulatory Visit | Attending: Family Medicine | Admitting: Family Medicine

## 2020-11-03 DIAGNOSIS — Z1231 Encounter for screening mammogram for malignant neoplasm of breast: Secondary | ICD-10-CM

## 2020-11-28 ENCOUNTER — Ambulatory Visit (INDEPENDENT_AMBULATORY_CARE_PROVIDER_SITE_OTHER): Payer: Medicare (Managed Care)

## 2020-11-28 DIAGNOSIS — I639 Cerebral infarction, unspecified: Secondary | ICD-10-CM

## 2020-12-02 LAB — CUP PACEART REMOTE DEVICE CHECK
Date Time Interrogation Session: 20220825230452
Implantable Pulse Generator Implant Date: 20210105

## 2020-12-14 NOTE — Progress Notes (Signed)
Carelink Summary Report / Loop Recorder 

## 2020-12-29 ENCOUNTER — Ambulatory Visit (INDEPENDENT_AMBULATORY_CARE_PROVIDER_SITE_OTHER): Payer: Medicare (Managed Care)

## 2020-12-29 DIAGNOSIS — I639 Cerebral infarction, unspecified: Secondary | ICD-10-CM | POA: Diagnosis not present

## 2021-01-04 LAB — CUP PACEART REMOTE DEVICE CHECK
Date Time Interrogation Session: 20220927230347
Implantable Pulse Generator Implant Date: 20210105

## 2021-01-04 NOTE — Progress Notes (Signed)
Carelink Summary Report / Loop Recorder 

## 2021-01-30 ENCOUNTER — Other Ambulatory Visit: Payer: Self-pay

## 2021-01-30 ENCOUNTER — Ambulatory Visit (INDEPENDENT_AMBULATORY_CARE_PROVIDER_SITE_OTHER): Payer: Medicare (Managed Care) | Admitting: Internal Medicine

## 2021-01-30 ENCOUNTER — Encounter: Payer: Self-pay | Admitting: Internal Medicine

## 2021-01-30 ENCOUNTER — Ambulatory Visit (INDEPENDENT_AMBULATORY_CARE_PROVIDER_SITE_OTHER): Payer: Medicare (Managed Care)

## 2021-01-30 DIAGNOSIS — D869 Sarcoidosis, unspecified: Secondary | ICD-10-CM

## 2021-01-30 DIAGNOSIS — D860 Sarcoidosis of lung: Secondary | ICD-10-CM | POA: Insufficient documentation

## 2021-01-30 NOTE — Assessment & Plan Note (Signed)
Dx by [facility]monary ? 2015  - not on prednisone since around ? 2020  A good rule of thumb is that >95% of pts with active sarcoid in any organ will have some plain cxr changes - on the other hand  if there are active pulmonary symptoms the cxr will look much worse than the patient:  No evidence of either scenario here/ strongly doubt active dz    Will need close opth f/u but here only every 3 months for now until we have a complete data base on previous w/u and firmly established clinical stability off steroids.         Each maintenance medication was reviewed in detail including emphasizing most importantly the difference between maintenance and prns and under what circumstances the prns are to be triggered using an action plan format where appropriate.  Total time for H and P, chart review, counseling,  and generating customized AVS unique to this office visit / same day charting = 

## 2021-01-30 NOTE — Progress Notes (Addendum)
Kathleen George, female    DOB: 1961/01/29,   MRN: [id number]   Brief patient profile:  26 yobf  quit smoking 2002  referred to pulmonary clinic 01/30/2021 by Dr Allena Napoleon for sarcoidosis with occular symptoms previous on Prednisone but off x since at least 2020     History of Present Illness  01/30/2021  Pulmonary/ 1st office eval/Kathleen George  Chief Complaint  Patient presents with   Consult  Dyspnea:  limited by R hemiparesis no breathing  Cough: none  Sleep: no resp problems SABA use: no inhalers   No obvious day to day or daytime variability or assoc excess/ purulent sputum or mucus plugs or hemoptysis or cp or chest tightness, subjective wheeze or overt sinus or hb symptoms.   Sleeping  without nocturnal  or early am exacerbation  of respiratory  c/o's or need for noct saba. Also denies any obvious fluctuation of symptoms with weather or environmental changes or other aggravating or alleviating factors except as outlined above   No unusual exposure hx or h/o childhood pna/ asthma or knowledge of premature birth.  Current Allergies, Complete Past Medical History, Past Surgical History, Family History, and Social History were reviewed in [employer] record.  ROS  The following are not active complaints unless bolded Hoarseness, sore throat, dysphagia, dental problems, itching, sneezing,  nasal congestion or discharge of excess mucus or purulent secretions, ear ache,   fever, chills, sweats, unintended wt loss or wt gain, classically pleuritic or exertional cp,  orthopnea pnd or arm/hand swelling  or leg swelling, presyncope, palpitations, abdominal pain, anorexia, nausea, vomiting, diarrhea  or change in bowel habits or change in bladder habits, change in stools or change in urine, dysuria, hematuria,  rash, arthralgias, visual complaints, headache, numbness, weakness or ataxia or problems with walking or coordination,  change in mood or  memory.           Past Medical  History:  Diagnosis Date   Diabetes mellitus without complication (HCC)    Glaucoma    Hypercholesterolemia    Hypertension    Prosthetic eye globe    Stroke [facility])     Outpatient Medications Prior to Visit  Medication Sig Dispense Refill   aspirin EC 81 MG EC tablet Take 1 tablet (81 mg total) by mouth daily. 30 tablet 0   atorvastatin (LIPITOR) 80 MG tablet Take 1 tablet (80 mg total) by mouth daily at 6 PM. 30 tablet 0   dorzolamide-timolol (COSOPT) 22.3-6.8 MG/ML ophthalmic solution Place 1 drop into the right eye 2 (two) times daily.     erythromycin ophthalmic ointment Place 1 application into the left eye at bedtime.     liraglutide (VICTOZA) 18 MG/3ML SOPN Inject 1.2 mg into the skin every evening.     No facility-administered medications prior to visit.     Objective:     BP 122/78 (BP Location: Left Arm, Cuff Size: Normal)   Pulse 93   Temp 98.3 F (36.8 C) (Oral)   Ht 5\' 4"  (1.626 m)   Wt 157 lb (71.2 kg)   SpO2 96% Comment: RA  BMI 26.95 kg/m   SpO2: 96 % (RA)  Amb wf with R leg brace and dense expressive aphasia    HEENT : pt wearing mask not removed for exam due to covid -19 concerns.    NECK :  without JVD/Nodes/TM/ nl carotid upstrokes bilaterally   LUNGS: no acc muscle use,  Nl contour chest which is clear to A  and P bilaterally without cough on insp or exp maneuvers   CV:  RRR  no s3 or murmur or increase in P2, and no edema   ABD:  soft and nontender with nl inspiratory excursion in the supine position. No bruits or organomegaly appreciated, bowel sounds nl  MS:   ext warm without deformities, calf tenderness, cyanosis or clubbing No obvious joint restrictions   SKIN: warm and dry without lesions    NEURO:  alert, R hemiparesis with dense expressive aphasia     I personally reviewed images / My impression is as follows:  CXR:  pa and lateral: No evidence of progression of ILD  vs prior study (portable 04/02/2019)  which is different  from radiology report.     Assessment   Sarcoidosis Dx by [facility] ? 2015  - not on prednisone since around ? 2020  A good rule of thumb is that >95% of pts with active sarcoid in any organ will have some plain cxr changes - on the other hand  if there are active pulmonary symptoms the cxr will look much worse than the patient:  No evidence of either scenario here/ strongly doubt active dz    Will need close opth f/u but here only every 3 months for now until we have a complete data base on previous w/u and firmly established clinical stability off steroids.         Each maintenance medication was reviewed in detail including emphasizing most importantly the difference between maintenance and prns and under what circumstances the prns are to be triggered using an action plan format where appropriate.  Total time for H and P, chart review, counseling,  and generating customized AVS unique to this office visit / same day charting =            Sandrea Hughs, MD 01/30/2021

## 2021-01-30 NOTE — Patient Instructions (Signed)
Please remember to go to the lab and x-ray department  for your tests - we will call you with the results when they are available.       No need for now to treat the sarcoidosis other than keep up with your eye doctor    Please schedule a follow up visit in 3 months but call sooner if needed - needs appt with Dr Everardo All

## 2021-02-06 ENCOUNTER — Ambulatory Visit (INDEPENDENT_AMBULATORY_CARE_PROVIDER_SITE_OTHER): Payer: Medicare (Managed Care)

## 2021-02-06 DIAGNOSIS — I639 Cerebral infarction, unspecified: Secondary | ICD-10-CM

## 2021-02-07 LAB — CUP PACEART REMOTE DEVICE CHECK
Date Time Interrogation Session: 20221030231103
Implantable Pulse Generator Implant Date: 20210105

## 2021-02-13 NOTE — Progress Notes (Signed)
Carelink Summary Report / Loop Recorder 

## 2021-03-13 ENCOUNTER — Ambulatory Visit (INDEPENDENT_AMBULATORY_CARE_PROVIDER_SITE_OTHER): Payer: Medicare (Managed Care)

## 2021-03-13 DIAGNOSIS — I639 Cerebral infarction, unspecified: Secondary | ICD-10-CM | POA: Diagnosis not present

## 2021-03-13 LAB — CUP PACEART REMOTE DEVICE CHECK
Date Time Interrogation Session: 20221202230215
Implantable Pulse Generator Implant Date: 20210105

## 2021-03-21 NOTE — Progress Notes (Signed)
Carelink Summary Report / Loop Recorder 

## 2021-04-13 LAB — CUP PACEART REMOTE DEVICE CHECK
Date Time Interrogation Session: 20230104230831
Implantable Pulse Generator Implant Date: 20210105

## 2021-04-17 ENCOUNTER — Ambulatory Visit (INDEPENDENT_AMBULATORY_CARE_PROVIDER_SITE_OTHER): Payer: Medicare (Managed Care)

## 2021-04-17 DIAGNOSIS — I639 Cerebral infarction, unspecified: Secondary | ICD-10-CM | POA: Diagnosis not present

## 2021-04-24 ENCOUNTER — Ambulatory Visit: Payer: Medicare (Managed Care) | Admitting: Pulmonary Disease

## 2021-04-25 NOTE — Progress Notes (Signed)
Carelink Summary Report / Loop Recorder 

## 2021-04-26 ENCOUNTER — Ambulatory Visit: Payer: Medicare (Managed Care) | Admitting: Pulmonary Disease

## 2021-05-15 ENCOUNTER — Encounter: Payer: Self-pay | Admitting: Pulmonary Disease

## 2021-05-15 ENCOUNTER — Ambulatory Visit (INDEPENDENT_AMBULATORY_CARE_PROVIDER_SITE_OTHER): Payer: Medicare (Managed Care) | Admitting: Pulmonary Disease

## 2021-05-15 ENCOUNTER — Other Ambulatory Visit: Payer: Self-pay

## 2021-05-15 VITALS — BP 110/70 | HR 95 | Temp 98.1°F | Ht 65.0 in | Wt 147.0 lb

## 2021-05-15 DIAGNOSIS — D860 Sarcoidosis of lung: Secondary | ICD-10-CM

## 2021-05-15 NOTE — Progress Notes (Signed)
Subjective:   PATIENT ID: Kathleen George GENDER: female DOB: March 16, 1961, MRN: [id number]   HPI  Chief Complaint  Patient presents with   Follow-up    sarcoid    Reason for Visit: New patient for sarcoid  [name] is a 61 year old female with sarcoid, cryptogenic stroke with residual right hemiparesis, DM2, HTN, left prosthetic eye globe who presents as a new patient for sarcoid management. History limited due to patient's stroke and health literacy. Patient declined for additional family members to be involved in interview.  She was previously seen by Dr. Melvyn Novas in Pulmonary clinic on 01/30/21 for sarcoid. She has a history of ocular involvement of sarcoid previously on steroids but has been off since 2020. Last appointment in August 2020 at [facility] with Dr. 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 She denies any pulmonary issues, no shortness of breath, cough or wheezing.  She currently lives with her daughter. Ambulates with a walker at baseline. She prefers to maintain as much independence as possible.  Social History: Former smoker. Quit in 2002.  I have personally reviewed patient's past medical/family/social history, allergies, current medications.  Past Medical History:  Diagnosis Date   Diabetes mellitus without complication ([place])    Glaucoma    Hypercholesterolemia    Hypertension    Prosthetic eye globe    Stroke ([place])      Family History  Problem Relation Age of Onset   Hypertension Mother    Hypertension Father      Social History   Occupational History   Not on file  Tobacco Use   Smoking status: Former    Types: Cigarettes    Quit date: 2002    Years since quitting: 21.1   Smokeless tobacco: Never  Substance and Sexual Activity   Alcohol use: Not Currently   Drug use: Never   Sexual activity: Not on file    Allergies  Allergen Reactions   Penicillins Anaphylaxis     Did it involve swelling of the face/tongue/throat, SOB, or low BP? Yes Did it involve sudden or severe rash/hives, skin peeling, or any reaction on the inside of your mouth or nose? N/A Did you need to seek medical attention at a hospital or doctor's office? N/A When did it last happen? Child   If all above answers are NO, may proceed with cephalosporin use.     Outpatient Medications Prior to Visit  Medication Sig Dispense Refill   aspirin EC 81 MG EC tablet Take 1 tablet (81 mg total) by mouth daily. 30 tablet 0   atorvastatin (LIPITOR) 80 MG tablet Take 1 tablet (80 mg total) by mouth daily at 6 PM. 30 tablet 0   dorzolamide-timolol (COSOPT) 22.3-6.8 MG/ML ophthalmic solution Place 1 drop into the right eye 2 (two) times daily.     erythromycin ophthalmic ointment Place 1 application into the left eye at bedtime.     liraglutide (VICTOZA) 18 MG/3ML SOPN Inject 1.2 mg into the skin every evening.     No facility-administered medications prior to visit.    Review of Systems  Constitutional:  Negative for chills, diaphoresis, fever, malaise/fatigue and weight loss.  HENT:  Negative for congestion.   Eyes:  Positive for blurred vision.  Respiratory:  Negative for cough, hemoptysis, sputum production, shortness of breath and wheezing.   Cardiovascular:  Negative for chest pain, palpitations and leg swelling.    Objective:  Vitals:   05/15/21 1554  BP: 110/70  Pulse: 95  Temp: 98.1 F (36.7 C)  TempSrc: Oral  SpO2: 97%  Weight: 147 lb (66.7 kg)  Height: 5\' 5"  (1.651 m)   SpO2: 97 % O2 Device: None (Room air)  Physical Exam: General: Chronically ill-appearing, no acute distress HENT: [place], AT, OP clear, MMM Eyes: EOMI in right eye, left eye prosthesis, no scleral icterus Respiratory: Clear to auscultation bilaterally.  No crackles, wheezing or rales Cardiovascular: RRR, -M/R/G, no JVD GI: BS+, soft, nontender Extremities:-Edema,-tenderness Neuro: AAO x4, CNII-XII  grossly intact Skin: Intact, no rashes or bruising Psych: Normal mood, normal affect  Data Reviewed:  Imaging: CXR 01/30/21 -Parenchymal scarring with reticular opacities bilaterally, mildly progressed compared to 03/2019  PFT: None on file  Labs: CBC    Component Value Date/Time   WBC 9.2 04/03/2019 0300   RBC 4.57 04/03/2019 0300   HGB 13.7 04/03/2019 0300   HCT 40.4 04/03/2019 0300   PLT 382 04/03/2019 0300   MCV 88.4 04/03/2019 0300   MCH 30.0 04/03/2019 0300   MCHC 33.9 04/03/2019 0300   RDW 12.8 04/03/2019 0300   LYMPHSABS 3.9 04/02/2019 1410   MONOABS 0.9 04/02/2019 1410   EOSABS 0.2 04/02/2019 1410   BASOSABS 0.1 04/02/2019 1410   CMP Latest Ref Rng & Units 04/03/2019 04/02/2019 04/02/2019  Glucose 70 - 99 mg/dL 165(H) 168(H) 172(H)  BUN 6 - 20 mg/dL 17 18 15   Creatinine 0.44 - 1.00 mg/dL 0.70 0.70 0.88  Sodium 135 - 145 mmol/L 139 139 139  Potassium 3.5 - 5.1 mmol/L 3.8 3.9 4.1  Chloride 98 - 111 mmol/L 101 102 102  CO2 22 - 32 mmol/L 26 - 25  Calcium 8.9 - 10.3 mg/dL 9.0 - 9.3  Total Protein 6.5 - 8.1 g/dL - - 7.5  Total Bilirubin 0.3 - 1.2 mg/dL - - 0.6  Alkaline Phos 38 - 126 U/L - - 103  AST 15 - 41 U/L - - 22  ALT 0 - 44 U/L - - 32   CBC and CMET on 04/01/21 reviewed at [facility] and within normal limits     Assessment & Plan:   Discussion: 61 year old female with sarcoid, cryptogenic stroke with residual right hemiparesis, DM2, HTN, left prosthetic eye globe who presents as a new patient for sarcoid management. History limited due to patient's stroke and health literacy. Patient declined for additional family members to be involved in interview.   We discussed the clinical course of sarcoid and management including serial PFTs, labs, eye exam, and EKG and chest imaging if indicated. If symptoms suggest sarcoid flare in the future, we would manage with steroids +/- biologics.  Pulmonary Sarcoidosis --[facility]ANGE for pulmonary function  test --Consider CT Chest pending PFT results  Ocular Sarcoid --Followed at [facility]. No active disease  Health Maintenance  There is no immunization history on file for this patient. CT Lung Screen - not qualified. Quit >15 years  Orders Placed This Encounter  Procedures   Pulmonary function test    Standing Status:   Future    Standing Expiration Date:   05/15/2022    Order Specific Question:   Where should this test be performed?    Answer:   [facility]    Order Specific Question:   Full PFT: includes the following: basic spirometry, spirometry pre & post bronchodilator, diffusion capacity (DLCO), lung volumes    Answer:   Full PFT  No orders of the defined types  were placed in this encounter.   No follow-ups on file. After PFTs  I have spent a total time of 42-minutes on the day of the appointment reviewing prior documentation, coordinating care and discussing medical diagnosis and plan with the patient/family. Imaging, labs and tests included in this note have been reviewed and interpreted independently by me.  [place], MD [place] [facility] 05/15/2021 4:11 PM  Office Number [phone]

## 2021-05-15 NOTE — Patient Instructions (Signed)
Pulmonary Sarcoidosis --ARRANGE for pulmonary function test --Consider CT Chest pending PFT results  Follow-up with me in March or April. Schedule PFTs prior to visit

## 2021-05-17 LAB — CUP PACEART REMOTE DEVICE CHECK
Date Time Interrogation Session: 20230206231122
Implantable Pulse Generator Implant Date: 20210105

## 2021-05-22 ENCOUNTER — Ambulatory Visit (INDEPENDENT_AMBULATORY_CARE_PROVIDER_SITE_OTHER): Payer: Medicare (Managed Care)

## 2021-05-22 DIAGNOSIS — I639 Cerebral infarction, unspecified: Secondary | ICD-10-CM | POA: Diagnosis not present

## 2021-05-29 NOTE — Progress Notes (Signed)
Carelink Summary Report / Loop Recorder 

## 2021-06-19 ENCOUNTER — Ambulatory Visit (INDEPENDENT_AMBULATORY_CARE_PROVIDER_SITE_OTHER): Payer: Medicare (Managed Care) | Admitting: Pulmonary Disease

## 2021-06-19 ENCOUNTER — Encounter: Payer: Self-pay | Admitting: Pulmonary Disease

## 2021-06-19 ENCOUNTER — Other Ambulatory Visit: Payer: Self-pay

## 2021-06-19 VITALS — BP 114/68 | HR 110 | Ht 65.0 in | Wt 159.0 lb

## 2021-06-19 DIAGNOSIS — D860 Sarcoidosis of lung: Secondary | ICD-10-CM

## 2021-06-19 LAB — COMPREHENSIVE METABOLIC PANEL
ALT: 15 U/L (ref 0–35)
AST: 16 U/L (ref 0–37)
Albumin: 4.3 g/dL (ref 3.5–5.2)
Alkaline Phosphatase: 75 U/L (ref 39–117)
BUN: 11 mg/dL (ref 6–23)
CO2: 27 mEq/L (ref 19–32)
Calcium: 9.9 mg/dL (ref 8.4–10.5)
Chloride: 105 mEq/L (ref 96–112)
Creatinine, Ser: 0.63 mg/dL (ref 0.40–1.20)
GFR: 97.23 mL/min (ref >60.00)
Glucose, Bld: 131 mg/dL — ABNORMAL HIGH (ref 70–99)
Potassium: 3.6 mEq/L (ref 3.5–5.1)
Sodium: 142 mEq/L (ref 135–145)
Total Bilirubin: 0.3 mg/dL (ref 0.2–1.2)
Total Protein: 7.6 g/dL (ref 6.0–8.3)

## 2021-06-19 LAB — PULMONARY FUNCTION TEST
DL/VA % pred: 101 %
DL/VA: 4.24 ml/min/mmHg/L
DLCO cor % pred: 56 %
DLCO cor: 11.87 ml/min/mmHg
DLCO unc % pred: 56 %
DLCO unc: 11.87 ml/min/mmHg
FEF 25-75 Post: 1.66 L/sec
FEF 25-75 Pre: 2.04 L/sec
FEF2575-%Change-Post: -18 %
FEF2575-%Pred-Post: 76 %
FEF2575-%Pred-Pre: 93 %
FEV1-%Change-Post: -4 %
FEV1-%Pred-Post: 76 %
FEV1-%Pred-Pre: 79 %
FEV1-Post: 1.68 L
FEV1-Pre: 1.76 L
FEV1FVC-%Change-Post: 0 %
FEV1FVC-%Pred-Pre: 108 %
FEV6-%Change-Post: -4 %
FEV6-%Pred-Post: 71 %
FEV6-%Pred-Pre: 74 %
FEV6-Post: 1.94 L
FEV6-Pre: 2.02 L
FEV6FVC-%Pred-Post: 103 %
FEV6FVC-%Pred-Pre: 103 %
FVC-%Change-Post: -4 %
FVC-%Pred-Post: 69 %
FVC-%Pred-Pre: 72 %
FVC-Post: 1.94 L
FVC-Pre: 2.04 L
Post FEV1/FVC ratio: 87 %
Post FEV6/FVC ratio: 100 %
Pre FEV1/FVC ratio: 87 %
Pre FEV6/FVC Ratio: 100 %
RV % pred: 44 %
RV: 0.9 L
TLC % pred: 59 %
TLC: 3.1 L

## 2021-06-19 LAB — CBC WITH DIFFERENTIAL/PLATELET
Basophils Absolute: 0 10*3/uL (ref 0.0–0.1)
Basophils Relative: 0.3 % (ref 0.0–3.0)
Eosinophils Absolute: 0.1 10*3/uL (ref 0.0–0.7)
Eosinophils Relative: 1.1 % (ref 0.0–5.0)
HCT: 34.9 % — ABNORMAL LOW (ref 36.0–46.0)
Hemoglobin: 11.8 g/dL — ABNORMAL LOW (ref 12.0–15.0)
Lymphocytes Relative: 31.7 % (ref 12.0–46.0)
Lymphs Abs: 3 10*3/uL (ref 0.7–4.0)
MCHC: 33.7 g/dL (ref 30.0–36.0)
MCV: 90.3 fl (ref 78.0–100.0)
Monocytes Absolute: 0.7 10*3/uL (ref 0.1–1.0)
Monocytes Relative: 7 % (ref 3.0–12.0)
Neutro Abs: 5.7 10*3/uL (ref 1.4–7.7)
Neutrophils Relative %: 59.9 % (ref 43.0–77.0)
Platelets: 333 10*3/uL (ref 150.0–400.0)
RBC: 3.87 Mil/uL (ref 3.87–5.11)
RDW: 13.6 % (ref 11.5–15.5)
WBC: 9.5 10*3/uL (ref 4.0–10.5)

## 2021-06-19 NOTE — Patient Instructions (Signed)
Full PFT performed today. °

## 2021-06-19 NOTE — Patient Instructions (Addendum)
?  Pulmonary sarcoidosis with restrictive defect ?--ORDER CT chest without contrast ?--ORDER CBC and CMP ?--Plan for routine EKG at next visit ?--Please schedule eye appointment at [facility] ? ?[facility] with Dr. Franchot Mimes ?Phone: [phone]   ?Please schedule appointment as soon as possible. Missed follow-up in January ? ?Follow-up with me in two months with CT Chest prior to visit ?

## 2021-06-19 NOTE — Progress Notes (Signed)
Full PFT performed today. °

## 2021-06-19 NOTE — Progress Notes (Unsigned)
Subjective:   PATIENT ID: Kathleen George GENDER: female DOB: 1960/10/23, MRN: [id number]   HPI  Chief Complaint  Patient presents with   Follow-up    Pft review    Reason for Visit: Follow-up sarcoid  Kathleen George is a 61 year old female with sarcoid, cryptogenic stroke with residual right hemiparesis, DM2, HTN, left prosthetic eye globe who presents follow-up sarcoid management.  History limited due to patient's stroke and health literacy.  Patient declined for additional family members to be involved in interview.    Synopsis: She was previously seen by Dr. Sherene Sires in Pulmonary clinic on 01/30/21 for sarcoid. She has a history of ocular involvement of sarcoid previously on steroids but has been off since 2020. Last appointment in November 14, 2020 at [facility] with Dr. 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 She denies any pulmonary issues, no shortness of breath, cough or wheezing. She currently lives with her daughter. Ambulates with a walker at baseline. She prefers to maintain as much independence as possible.  06/19/21 She completed PFTs. Denies shortness, cough or wheezing. Denies chest pain or lower extremity swelling.  Social History: Former smoker. Quit in 2002.  Past Medical History:  Diagnosis Date   Diabetes mellitus without complication (HCC)    Glaucoma    Hypercholesterolemia    Hypertension    Prosthetic eye globe    Stroke (HCC)      Family History  Problem Relation Age of Onset   Hypertension Mother    Hypertension Father      Social History   Occupational History   Not on file  Tobacco Use   Smoking status: Former    Types: Cigarettes    Quit date: 2002    Years since quitting: 21.2   Smokeless tobacco: Never  Substance and Sexual Activity   Alcohol use: Not Currently   Drug use: Never   Sexual activity: Not on file    Allergies  Allergen Reactions   Penicillins  Anaphylaxis    Did it involve swelling of the face/tongue/throat, SOB, or low BP? Yes Did it involve sudden or severe rash/hives, skin peeling, or any reaction on the inside of your mouth or nose? N/A Did you need to seek medical attention at a hospital or doctor's office? N/A When did it last happen? Child   If all above answers are NO, may proceed with cephalosporin use.     Outpatient Medications Prior to Visit  Medication Sig Dispense Refill   aspirin EC 81 MG EC tablet Take 1 tablet (81 mg total) by mouth daily. 30 tablet 0   atorvastatin (LIPITOR) 80 MG tablet Take 1 tablet (80 mg total) by mouth daily at 6 PM. 30 tablet 0   dorzolamide-timolol (COSOPT) 22.3-6.8 MG/ML ophthalmic solution Place 1 drop into the right eye 2 (two) times daily.     erythromycin ophthalmic ointment Place 1 application into the left eye at bedtime.     liraglutide (VICTOZA) 18 MG/3ML SOPN Inject 1.2 mg into the skin every evening.     No facility-administered medications prior to visit.    Review of Systems  Constitutional:  Negative for chills, diaphoresis, fever, malaise/fatigue and weight loss.  HENT:  Negative for congestion.   Eyes:  Positive for blurred vision.  Respiratory:  Negative for cough, hemoptysis, sputum production, shortness of breath and wheezing.   Cardiovascular:  Negative for chest pain, palpitations and leg swelling.  Objective:   Vitals:   06/19/21 1342  BP: 114/68  Pulse: (!) 110  SpO2: 98%  Weight: 159 lb (72.1 kg)  Height: 5\' 5"  (1.651 m)   SpO2: 98 % O2 Device: None (Room air)  Physical Exam: General: Chronically ill-appearing, no acute distress HENT: [place], AT, OP clear, MMM Eyes: EOMI in right eye, left eye prosthesis, no scleral icterus Respiratory: Clear to auscultation bilaterally.  No crackles, wheezing or rales Cardiovascular: RRR, -M/R/G, no JVD GI: BS+, soft, nontender Extremities:-Edema,-tenderness Neuro: AAO x4, CNII-XII grossly intact Skin:  Intact, no rashes or bruising Psych: Normal mood, normal affect  Data Reviewed:  Imaging: CXR 01/30/21 -Parenchymal scarring with reticular opacities bilaterally, mildly progressed compared to 03/2019  PFT: 06/19/21 FVC 1.94 (69%) FEV1 1.68 (76%) Ratio 87  TLC 59% DLCO 56% Interpretation: Mild restrictive defect with moderately reduced gas exchange   Labs: CBC    Component Value Date/Time   WBC 9.2 04/03/2019 0300   RBC 4.57 04/03/2019 0300   HGB 13.7 04/03/2019 0300   HCT 40.4 04/03/2019 0300   PLT 382 04/03/2019 0300   MCV 88.4 04/03/2019 0300   MCH 30.0 04/03/2019 0300   MCHC 33.9 04/03/2019 0300   RDW 12.8 04/03/2019 0300   LYMPHSABS 3.9 04/02/2019 1410   MONOABS 0.9 04/02/2019 1410   EOSABS 0.2 04/02/2019 1410   BASOSABS 0.1 04/02/2019 1410   CMP Latest Ref Rng & Units 04/03/2019 04/02/2019 04/02/2019  Glucose 70 - 99 mg/dL 165(H) 168(H) 172(H)  BUN 6 - 20 mg/dL 17 18 15   Creatinine 0.44 - 1.00 mg/dL 0.70 0.70 0.88  Sodium 135 - 145 mmol/L 139 139 139  Potassium 3.5 - 5.1 mmol/L 3.8 3.9 4.1  Chloride 98 - 111 mmol/L 101 102 102  CO2 22 - 32 mmol/L 26 - 25  Calcium 8.9 - 10.3 mg/dL 9.0 - 9.3  Total Protein 6.5 - 8.1 g/dL - - 7.5  Total Bilirubin 0.3 - 1.2 mg/dL - - 0.6  Alkaline Phos 38 - 126 U/L - - 103  AST 15 - 41 U/L - - 22  ALT 0 - 44 U/L - - 32   CBC and CMET on 04/01/21 reviewed at [facility] and within normal limits     Assessment & Plan:   Discussion: 61 year old female with sarcoid, cryptogenic stroke with residual right hemiparesis, DM2, HTN, left prosthetic eye globe who presents follow-up for sarcoid.  History limited due to patient's stroke and health literacy.  Patient declined for additional family members to be involved in interview.   We discussed the clinical course of sarcoid and management including serial PFTs, labs, eye exam, and EKG and chest imaging if indicated. If symptoms suggest sarcoid flare in the future, we would manage  with steroids +/- biologics.  Pulmonary sarcoidosis with restrictive defect - Dx in 2019*** via endobronchial bx per patient - No indication for prednisone therapy - Annual PFTs.  Due 06/2022 - Recommend annual ophthalmology exam.  Last visit on 11/2020. Patient to schedule follow-up - Plan for routine EKG at next visit - QuantiFERON-TB needed if planning to initiate immunosuppressants in the future - ORDER CBC and CMP - ORDER CT chest without contrast   Ocular Sarcoid --Followed at [facility]. Missed follow-up. Advised patient to contact office for appointment  Health Maintenance  There is no immunization history on file for this patient. CT Lung Screen - not qualified. Quit >15 years  No orders of the defined types were placed in this encounter. No orders of  the defined types were placed in this encounter.   No follow-ups on file. After PFTs  I have spent a total time of 42-minutes on the day of the appointment reviewing prior documentation, coordinating care and discussing medical diagnosis and plan with the patient/family. Imaging, labs and tests included in this note have been reviewed and interpreted independently by me.  [place], MD [place] [facility] 06/19/2021 2:09 PM  Office Number [phone]

## 2021-06-22 ENCOUNTER — Encounter: Payer: Self-pay | Admitting: Pulmonary Disease

## 2021-06-26 ENCOUNTER — Ambulatory Visit (INDEPENDENT_AMBULATORY_CARE_PROVIDER_SITE_OTHER): Payer: Medicare (Managed Care)

## 2021-06-26 DIAGNOSIS — I639 Cerebral infarction, unspecified: Secondary | ICD-10-CM | POA: Diagnosis not present

## 2021-06-26 LAB — CUP PACEART REMOTE DEVICE CHECK
Date Time Interrogation Session: 20230319231418
Implantable Pulse Generator Implant Date: 20210105

## 2021-07-04 NOTE — Progress Notes (Signed)
Carelink Summary Report / Loop Recorder 

## 2021-07-17 ENCOUNTER — Other Ambulatory Visit: Payer: Self-pay | Admitting: Family Medicine

## 2021-07-17 DIAGNOSIS — Z1231 Encounter for screening mammogram for malignant neoplasm of breast: Secondary | ICD-10-CM

## 2021-07-31 ENCOUNTER — Ambulatory Visit (INDEPENDENT_AMBULATORY_CARE_PROVIDER_SITE_OTHER): Payer: Medicare (Managed Care)

## 2021-07-31 DIAGNOSIS — I639 Cerebral infarction, unspecified: Secondary | ICD-10-CM

## 2021-07-31 LAB — CUP PACEART REMOTE DEVICE CHECK
Date Time Interrogation Session: 20230421230455
Implantable Pulse Generator Implant Date: 20210105

## 2021-08-15 NOTE — Progress Notes (Signed)
Carelink Summary Report / Loop Recorder 

## 2021-08-22 ENCOUNTER — Ambulatory Visit: Payer: Medicare (Managed Care) | Admitting: Pulmonary Disease

## 2021-08-22 NOTE — Progress Notes (Deleted)
Subjective:   PATIENT ID: Kathleen George GENDER: female DOB: 1960-08-04, MRN: [id number]   HPI  No chief complaint on file.   Reason for Visit: Follow-up sarcoid  [name] is a 61 year old female with sarcoid, cryptogenic stroke with residual right hemiparesis, DM2, HTN, left prosthetic eye globe who presents follow-up sarcoid management.  History limited due to patient's stroke and health literacy.  Patient declined for additional family members to be involved in interview.    Synopsis: She was previously seen by Dr. Sherene Sires in Pulmonary clinic on 01/30/21 for sarcoid. She has a history of ocular involvement of sarcoid previously on steroids but has been off since 2020. Last appointment in November 14, 2020 at [facility] with Dr. 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 She denies any pulmonary issues, no shortness of breath, cough or wheezing. She currently lives with her daughter. Ambulates with a walker at baseline. She prefers to maintain as much independence as possible.  06/19/21 She completed PFTs. Denies shortness, cough or wheezing. Denies chest pain or lower extremity swelling.  Social History: Former smoker. Quit in 2002.  Past Medical History:  Diagnosis Date   Diabetes mellitus without complication (HCC)    Glaucoma    Hypercholesterolemia    Hypertension    Prosthetic eye globe    Stroke (HCC)      Family History  Problem Relation Age of Onset   Hypertension Mother    Hypertension Father      Social History   Occupational History   Not on file  Tobacco Use   Smoking status: Former    Types: Cigarettes    Quit date: 2002    Years since quitting: 21.3   Smokeless tobacco: Never  Substance and Sexual Activity   Alcohol use: Not Currently   Drug use: Never   Sexual activity: Not on file    Allergies  Allergen Reactions   Penicillins Anaphylaxis    Did it involve swelling of the  face/tongue/throat, SOB, or low BP? Yes Did it involve sudden or severe rash/hives, skin peeling, or any reaction on the inside of your mouth or nose? N/A Did you need to seek medical attention at a hospital or doctor's office? N/A When did it last happen? Child   If all above answers are "NO", may proceed with cephalosporin use.     Outpatient Medications Prior to Visit  Medication Sig Dispense Refill   aspirin EC 81 MG EC tablet Take 1 tablet (81 mg total) by mouth daily. 30 tablet 0   atorvastatin (LIPITOR) 80 MG tablet Take 1 tablet (80 mg total) by mouth daily at 6 PM. 30 tablet 0   dorzolamide-timolol (COSOPT) 22.3-6.8 MG/ML ophthalmic solution Place 1 drop into the right eye 2 (two) times daily.     erythromycin ophthalmic ointment Place 1 application into the left eye at bedtime.     liraglutide (VICTOZA) 18 MG/3ML SOPN Inject 1.2 mg into the skin every evening.     No facility-administered medications prior to visit.    Review of Systems  Constitutional:  Negative for chills, diaphoresis, fever, malaise/fatigue and weight loss.  HENT:  Negative for congestion.   Eyes:  Positive for blurred vision.  Respiratory:  Negative for cough, hemoptysis, sputum production, shortness of breath and wheezing.   Cardiovascular:  Negative for chest pain, palpitations and leg swelling.    Objective:   There were no vitals  filed for this visit.     Physical Exam: General: Chronically ill-appearing, no acute distress HENT: , AT Eyes: EOMI, no scleral icterus, left eye prosthesis Respiratory: Clear to auscultation bilaterally.  No crackles, wheezing or rales Cardiovascular: RRR, -M/R/G, no JVD Extremities:-Edema,-tenderness Neuro: AAO x4, CNII-XII grossly intact Psych: Normal mood, normal affect  Data Reviewed:  Imaging: CXR 01/30/21 -Parenchymal scarring with reticular opacities bilaterally, mildly progressed compared to 03/2019  PFT: 06/19/21 FVC 1.94 (69%) FEV1 1.68 (76%)  Ratio 87  TLC 59% DLCO 56% Interpretation: Mild restrictive defect with moderately reduced gas exchange   Labs: CBC    Component Value Date/Time   WBC 9.5 06/19/2021 1426   RBC 3.87 06/19/2021 1426   HGB 11.8 (L) 06/19/2021 1426   HCT 34.9 (L) 06/19/2021 1426   PLT 333.0 06/19/2021 1426   MCV 90.3 06/19/2021 1426   MCH 30.0 04/03/2019 0300   MCHC 33.7 06/19/2021 1426   RDW 13.6 06/19/2021 1426   LYMPHSABS 3.0 06/19/2021 1426   MONOABS 0.7 06/19/2021 1426   EOSABS 0.1 06/19/2021 1426   BASOSABS 0.0 06/19/2021 1426      Latest Ref Rng & Units 06/19/2021    2:26 PM 04/03/2019    3:00 AM 04/02/2019    2:22 PM  CMP  Glucose 70 - 99 mg/dL 131   165   168    BUN 6 - 23 mg/dL 11   17   18     Creatinine 0.40 - 1.20 mg/dL 0.63   0.70   0.70    Sodium 135 - 145 mEq/L 142   139   139    Potassium 3.5 - 5.1 mEq/L 3.6   3.8   3.9    Chloride 96 - 112 mEq/L 105   101   102    CO2 19 - 32 mEq/L 27   26     Calcium 8.4 - 10.5 mg/dL 9.9   9.0     Total Protein 6.0 - 8.3 g/dL 7.6      Total Bilirubin 0.2 - 1.2 mg/dL 0.3      Alkaline Phos 39 - 117 U/L 75      AST 0 - 37 U/L 16      ALT 0 - 35 U/L 15       CBC and CMET on 04/01/21 reviewed at [facility] and within normal limits     Assessment & Plan:   Discussion: 61 year old female with sarcoid, cryptogenic stroke with residual right hemiparesis, DM2, HTN, left prosthetic eye globe who presents follow-up for sarcoid.  History limited due to patient's stroke and health literacy.  Patient declined for additional family members to be involved in interview.  We discussed the clinical course of sarcoid and management including serial PFTs, labs, eye exam, and EKG and chest imaging if indicated. If symptoms suggest sarcoid flare in the future, we would manage with steroids +/- biologics.  Pulmonary sarcoidosis with restrictive defect - Dx in 05/2012 with ocular sarcoid - No indication for prednisone therapy - Annual PFTs.  Due  06/2022 - Recommend annual ophthalmology exam.  Last visit on 11/2020. Patient to schedule follow-up - Plan for routine EKG at next visit - QuantiFERON-TB needed if planning to initiate immunosuppressants in the future - ORDER CBC and CMP - ORDER CT chest without contrast  Ocular Sarcoid --Followed at [facility]. Missed follow-up. Advised patient to contact office for appointment  Health Maintenance  There is no immunization history on file for this patient. CT Lung  Screen - not qualified. Quit >15 years  No orders of the defined types were placed in this encounter. No orders of the defined types were placed in this encounter.   No follow-ups on file.  I have spent a total time of 35-minutes on the day of the appointment reviewing prior documentation, coordinating care and discussing medical diagnosis and plan with the patient/family. Past medical history, allergies, medications were reviewed. Pertinent imaging, labs and tests included in this note have been reviewed and interpreted independently by me.  [place], MD [place] [facility] 08/22/2021 8:59 AM  Office Number [phone]

## 2021-08-31 LAB — CUP PACEART REMOTE DEVICE CHECK
Date Time Interrogation Session: 20230524230929
Implantable Pulse Generator Implant Date: 20210105

## 2021-09-05 ENCOUNTER — Ambulatory Visit (INDEPENDENT_AMBULATORY_CARE_PROVIDER_SITE_OTHER): Payer: Medicare (Managed Care)

## 2021-09-05 DIAGNOSIS — I639 Cerebral infarction, unspecified: Secondary | ICD-10-CM | POA: Diagnosis not present

## 2021-09-20 NOTE — Progress Notes (Signed)
Carelink Summary Report / Loop Recorder 

## 2021-09-29 ENCOUNTER — Ambulatory Visit
Admission: RE | Admit: 2021-09-29 | Discharge: 2021-09-29 | Disposition: A | Discharge status: Home / Self Care | Payer: Medicare (Managed Care) | Source: Ambulatory Visit | Attending: Internal Medicine | Admitting: Internal Medicine

## 2021-09-29 ENCOUNTER — Other Ambulatory Visit: Payer: Self-pay | Admitting: Internal Medicine

## 2021-09-29 DIAGNOSIS — R52 Pain, unspecified: Secondary | ICD-10-CM

## 2021-09-29 DIAGNOSIS — W19XXXA Unspecified fall, initial encounter: Secondary | ICD-10-CM

## 2021-10-09 ENCOUNTER — Ambulatory Visit (INDEPENDENT_AMBULATORY_CARE_PROVIDER_SITE_OTHER): Payer: Medicare (Managed Care)

## 2021-10-09 DIAGNOSIS — I639 Cerebral infarction, unspecified: Secondary | ICD-10-CM | POA: Diagnosis not present

## 2021-10-10 LAB — CUP PACEART REMOTE DEVICE CHECK
Date Time Interrogation Session: 20230702230523
Implantable Pulse Generator Implant Date: 20210105

## 2021-10-20 ENCOUNTER — Ambulatory Visit
Admission: RE | Admit: 2021-10-20 | Discharge: 2021-10-20 | Disposition: A | Discharge status: Home / Self Care | Payer: Medicare (Managed Care) | Source: Ambulatory Visit | Attending: Family Medicine | Admitting: Family Medicine

## 2021-10-20 ENCOUNTER — Other Ambulatory Visit: Payer: Self-pay | Admitting: Family Medicine

## 2021-10-20 DIAGNOSIS — R52 Pain, unspecified: Secondary | ICD-10-CM

## 2021-11-02 NOTE — Progress Notes (Signed)
Carelink Summary Report / Loop Recorder 

## 2021-11-06 ENCOUNTER — Ambulatory Visit
Admission: RE | Admit: 2021-11-06 | Discharge: 2021-11-06 | Disposition: A | Discharge status: Home / Self Care | Payer: Medicare (Managed Care) | Source: Ambulatory Visit | Attending: Family Medicine | Admitting: Family Medicine

## 2021-11-06 DIAGNOSIS — Z1231 Encounter for screening mammogram for malignant neoplasm of breast: Secondary | ICD-10-CM

## 2021-11-13 ENCOUNTER — Ambulatory Visit (INDEPENDENT_AMBULATORY_CARE_PROVIDER_SITE_OTHER): Payer: Medicare (Managed Care)

## 2021-11-13 DIAGNOSIS — I639 Cerebral infarction, unspecified: Secondary | ICD-10-CM | POA: Diagnosis not present

## 2021-11-13 LAB — CUP PACEART REMOTE DEVICE CHECK
Date Time Interrogation Session: 20230804231016
Implantable Pulse Generator Implant Date: 20210105

## 2021-12-12 ENCOUNTER — Other Ambulatory Visit: Payer: Self-pay

## 2021-12-12 DIAGNOSIS — Z1231 Encounter for screening mammogram for malignant neoplasm of breast: Secondary | ICD-10-CM

## 2021-12-13 LAB — CUP PACEART REMOTE DEVICE CHECK
Date Time Interrogation Session: 20230906231735
Implantable Pulse Generator Implant Date: 20210105

## 2021-12-13 NOTE — Progress Notes (Signed)
Carelink Summary Report / Loop Recorder 

## 2021-12-18 ENCOUNTER — Ambulatory Visit (INDEPENDENT_AMBULATORY_CARE_PROVIDER_SITE_OTHER): Payer: Medicare (Managed Care)

## 2021-12-18 DIAGNOSIS — I639 Cerebral infarction, unspecified: Secondary | ICD-10-CM | POA: Diagnosis not present

## 2021-12-23 IMAGING — MG MM DIGITAL SCREENING BILAT W/ TOMO AND CAD
8 series · 9 of 24 positions shown · non-contrast
Comparison: Previous exam(s).

CLINICAL DATA: Screening.

EXAM:
DIGITAL SCREENING BILATERAL MAMMOGRAM WITH TOMOSYNTHESIS AND CAD
TECHNIQUE: Bilateral screening digital craniocaudal and mediolateral oblique
mammograms were obtained. Bilateral screening digital breast
tomosynthesis was performed. The images were evaluated with
computer-aided detection.

[L CC synth-2D]
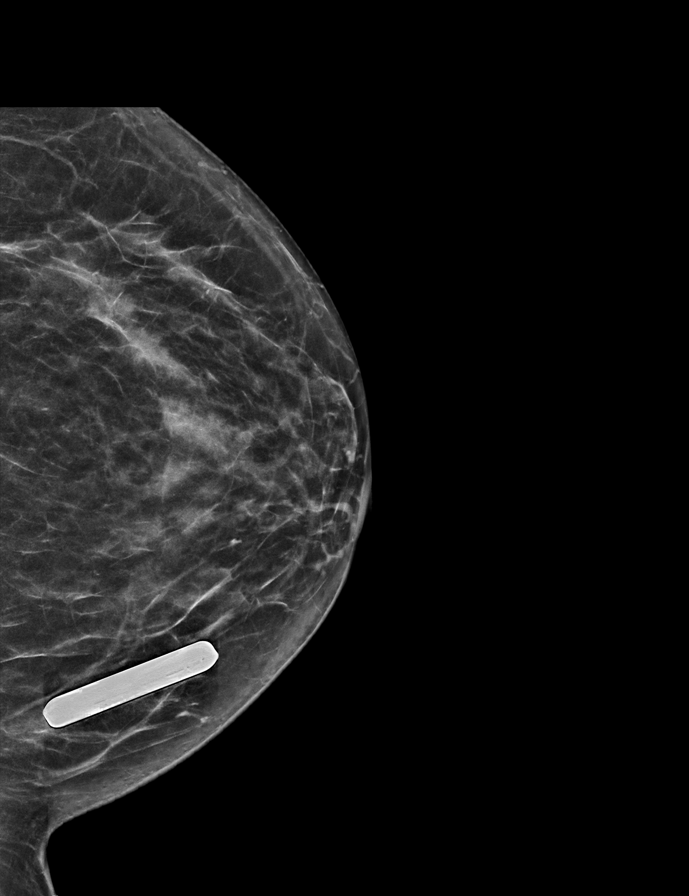

[R MLO synth-2D]
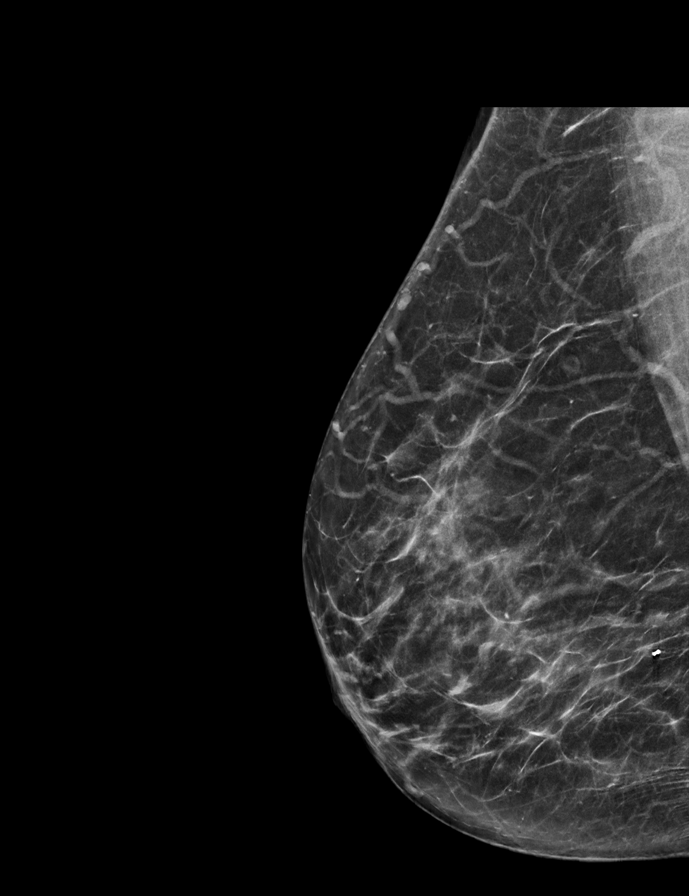

[L MLO synth-2D]
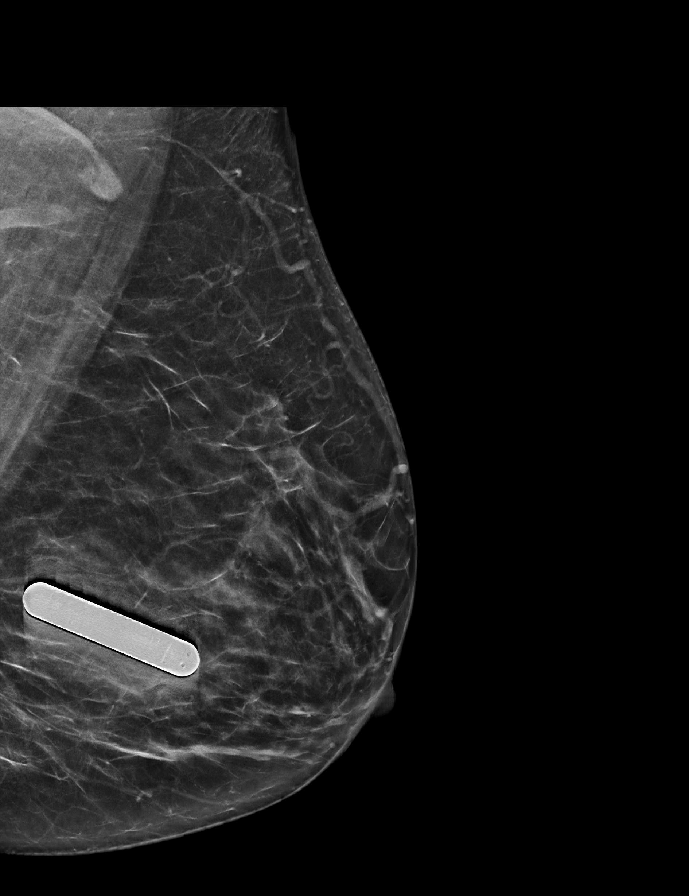

[R CC synth-2D]
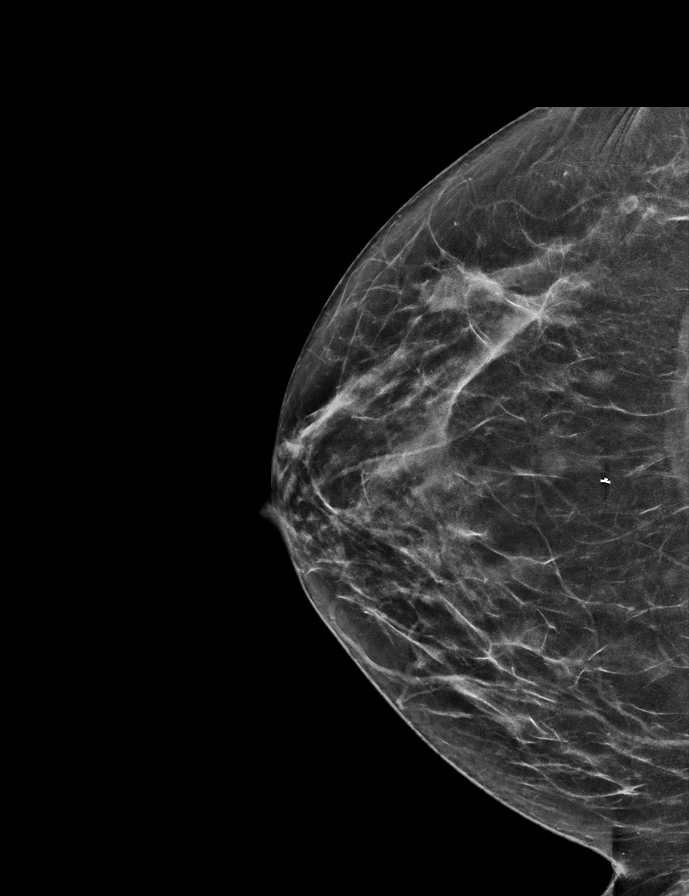

[R CC tomo · 2 of 55 frames shown]
[frame 18/55]
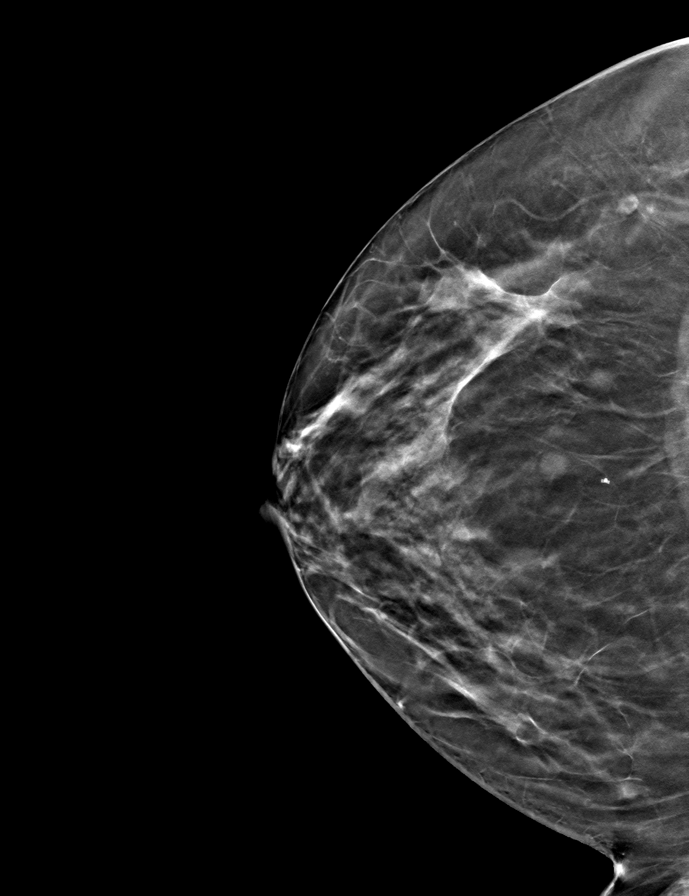
[frame 28/55]
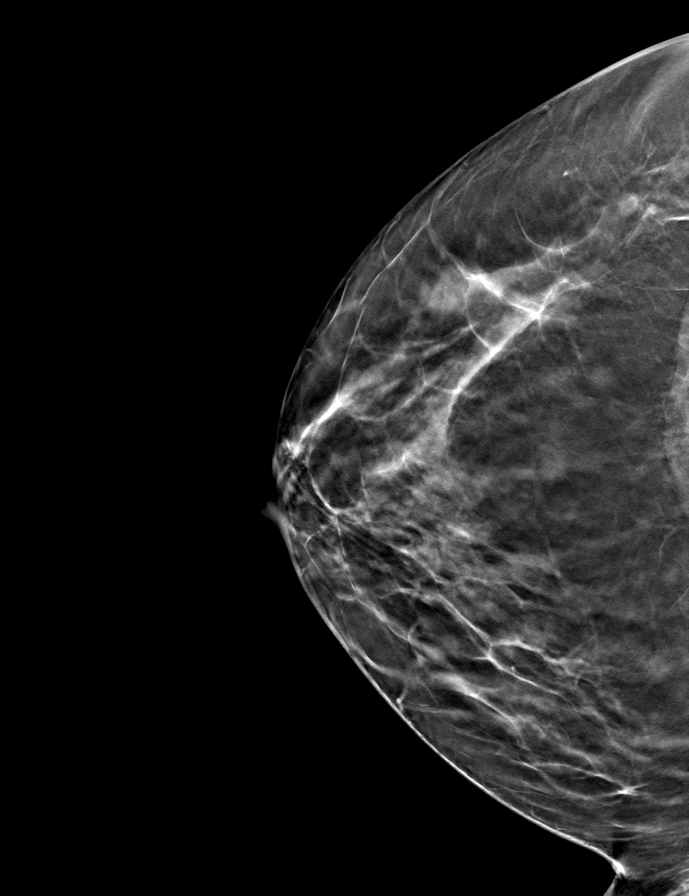

[L MLO tomo · tomo slice 31/60.0]
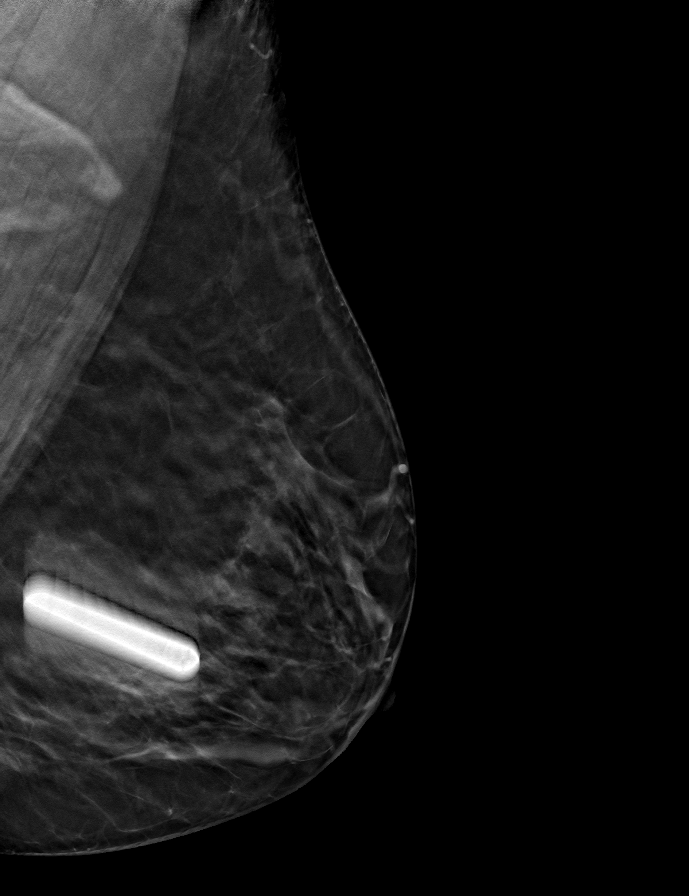

[L CC tomo · tomo slice 27/54.0]
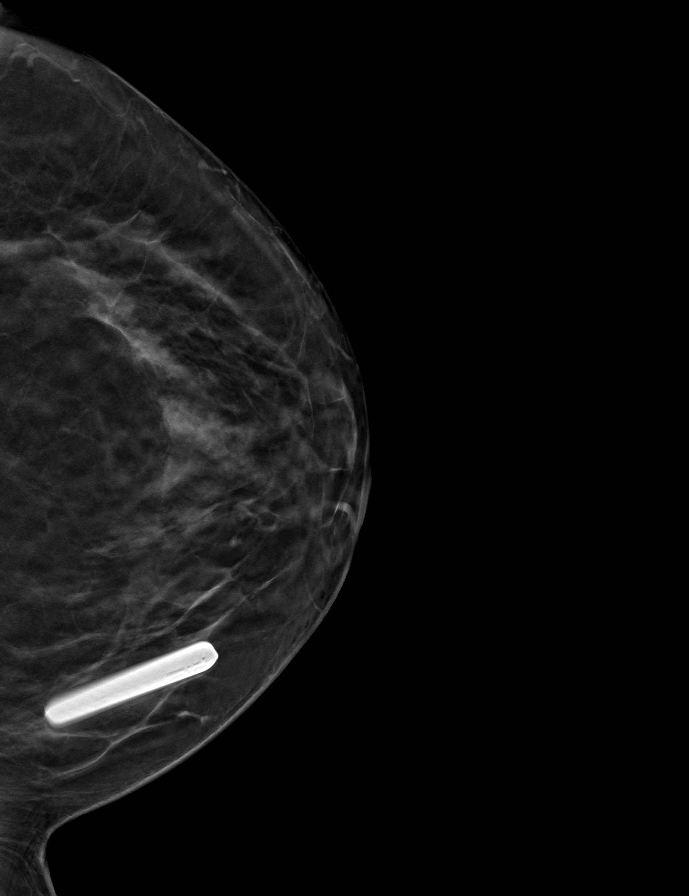

[R MLO tomo · tomo slice 31/61.0]
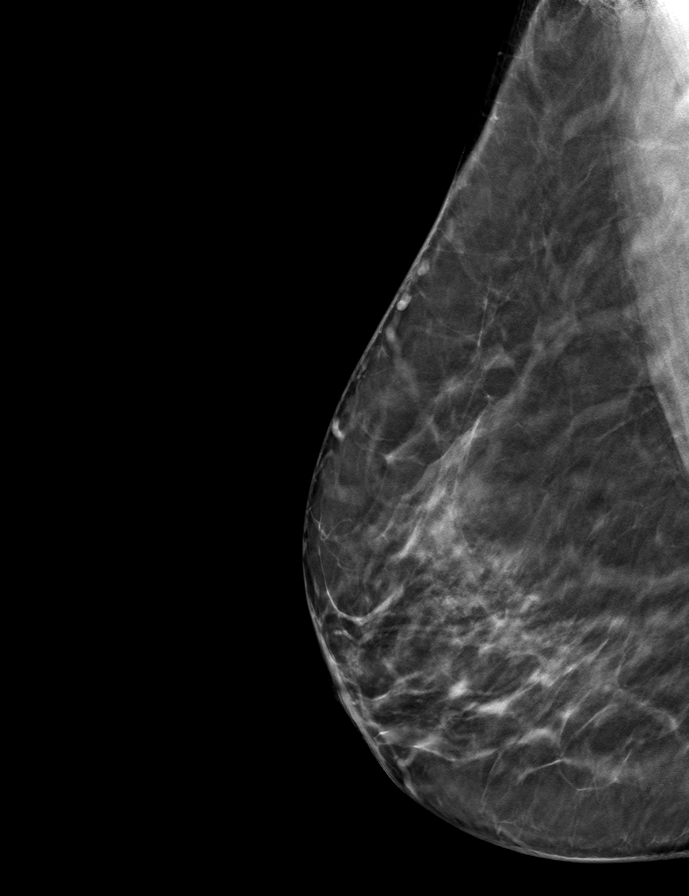

[9 of 24 positions shown; findings below may reference images not displayed]

ACR Breast Density Category c: The breast tissue is heterogeneously
dense, which may obscure small masses.
FINDINGS: There are no findings suspicious for malignancy.
IMPRESSION: No mammographic evidence of malignancy. A result letter of this
screening mammogram will be mailed directly to the patient.

RECOMMENDATION:
Screening mammogram in one year. (Code:Q3-W-BC3)

BI-RADS CATEGORY  1: Negative.

## 2021-12-29 ENCOUNTER — Ambulatory Visit (INDEPENDENT_AMBULATORY_CARE_PROVIDER_SITE_OTHER): Payer: Medicare (Managed Care) | Admitting: Pulmonary Disease

## 2021-12-29 ENCOUNTER — Encounter: Payer: Self-pay | Admitting: Pulmonary Disease

## 2021-12-29 VITALS — BP 128/64 | HR 91 | Ht 65.0 in | Wt 150.0 lb

## 2021-12-29 DIAGNOSIS — D860 Sarcoidosis of lung: Secondary | ICD-10-CM | POA: Diagnosis not present

## 2021-12-29 NOTE — Patient Instructions (Signed)
ORDER PFT in 6 months  Follow-up with me in 6 months

## 2021-12-29 NOTE — Progress Notes (Unsigned)
Subjective:   PATIENT ID: Kathleen George GENDER: female DOB: 05/23/1960, MRN: [id number]   HPI  Chief Complaint  Patient presents with   Follow-up    Doing good     Reason for Visit: Follow-up sarcoid  Kathleen George is a 61 year old female with sarcoid, cryptogenic stroke with residual right hemiparesis, DM2, HTN, left prosthetic eye globe who presents follow-up sarcoid management.  History limited due to patient's stroke and health literacy. Patient declined for additional family members to be involved in interview.   Synopsis: She was previously seen by Dr. Melvyn Novas in Pulmonary clinic on 01/30/21 for sarcoid. She has a history of ocular involvement of sarcoid previously on steroids but has been off since 2020. Last appointment in November 14, 2020 at [facility] with Dr. 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 She denies any pulmonary issues, no shortness of breath, cough or wheezing. She currently lives with her daughter. Ambulates with a walker at baseline. She prefers to maintain as much independence as possible.  06/19/21 She completed PFTs. Denies shortness, cough or wheezing. Denies chest pain or lower extremity swelling.  12/29/21 Since our last visit she reports overall doing well. No cough, shortness of breath or wheezing. Denies fatigue, night sweats, unexplained fevers/chills. Unchanged visual blurriness and followed by [facility] for sarcoid related uveitis. Note from 5/31 reviewed.  Severe right uveitic glaucoma. Currently on eye drops.  Ambulates with wheelchair due to her right sided stroke.  Social History: Former smoker. Quit in 2002.  Past Medical History:  Diagnosis Date   Diabetes mellitus without complication ([place])    Glaucoma    Hypercholesterolemia    Hypertension    Prosthetic eye globe    Stroke ([place])      Family History  Problem Relation Age of Onset   Hypertension Mother    Hypertension  Father      Social History   Occupational History   Not on file  Tobacco Use   Smoking status: Former    Types: Cigarettes    Quit date: 2002    Years since quitting: 21.7   Smokeless tobacco: Never  Substance and Sexual Activity   Alcohol use: Not Currently   Drug use: Never   Sexual activity: Not on file    Allergies  Allergen Reactions   Penicillins Anaphylaxis    Did it involve swelling of the face/tongue/throat, SOB, or low BP? Yes Did it involve sudden or severe rash/hives, skin peeling, or any reaction on the inside of your mouth or nose? N/A Did you need to seek medical attention at a hospital or doctor's office? N/A When did it last happen? Child   If all above answers are "NO", may proceed with cephalosporin use.     Outpatient Medications Prior to Visit  Medication Sig Dispense Refill   aspirin EC 81 MG EC tablet Take 1 tablet (81 mg total) by mouth daily. 30 tablet 0   atorvastatin (LIPITOR) 80 MG tablet Take 1 tablet (80 mg total) by mouth daily at 6 PM. 30 tablet 0   dorzolamide-timolol (COSOPT) 22.3-6.8 MG/ML ophthalmic solution Place 1 drop into the right eye 2 (two) times daily.     erythromycin ophthalmic ointment Place 1 application into the left eye at bedtime.     liraglutide (VICTOZA) 18 MG/3ML SOPN Inject 1.2 mg into the skin every evening.     No facility-administered medications prior to visit.  Review of Systems  Constitutional:  Negative for chills, diaphoresis, fever, malaise/fatigue and weight loss.  HENT:  Negative for congestion.   Respiratory:  Negative for cough, hemoptysis, sputum production, shortness of breath and wheezing.   Cardiovascular:  Negative for chest pain, palpitations and leg swelling.     Objective:   Vitals:   12/29/21 1339  BP: 128/64  Pulse: 91  SpO2: 99%  Weight: 68 kg  Height: 5\' 5"  (1.651 m)   SpO2: 99 % O2 Device: None (Room air)  Physical Exam: General: Chronically-appearing, no acute distress,  wheelchair bound HENT: [place], AT Eyes: EOMI, no scleral icterus, left eye prosthesis Respiratory: Clear to auscultation bilaterally.  No crackles, wheezing or rales Cardiovascular: RRR, -M/R/G, no JVD Extremities:-Edema,-tenderness Neuro: AAO x4, CNII-XII grossly intact Psych: Pleasant mood. Word finding. Slurred/dysarthric    Data Reviewed:  Imaging: CXR 01/30/21 -Parenchymal scarring with reticular opacities bilaterally, mildly progressed compared to 03/2019  PFT: 06/19/21 FVC 1.94 (69%) FEV1 1.68 (76%) Ratio 87  TLC 59% DLCO 56% Interpretation: Mild restrictive defect with moderately reduced gas exchange   Labs: CBC    Component Value Date/Time   WBC 9.5 06/19/2021 1426   RBC 3.87 06/19/2021 1426   HGB 11.8 (L) 06/19/2021 1426   HCT 34.9 (L) 06/19/2021 1426   PLT 333.0 06/19/2021 1426   MCV 90.3 06/19/2021 1426   MCH 30.0 04/03/2019 0300   MCHC 33.7 06/19/2021 1426   RDW 13.6 06/19/2021 1426   LYMPHSABS 3.0 06/19/2021 1426   MONOABS 0.7 06/19/2021 1426   EOSABS 0.1 06/19/2021 1426   BASOSABS 0.0 06/19/2021 1426      Latest Ref Rng & Units 06/19/2021    2:26 PM 04/03/2019    3:00 AM 04/02/2019    2:22 PM  CBC  WBC 4.0 - 10.5 K/uL 9.5  9.2    Hemoglobin 12.0 - 15.0 g/dL 04/04/2019  96.2  83.6   Hematocrit 36.0 - 46.0 % 34.9  40.4  43.0   Platelets 150.0 - 400.0 K/uL 333.0  382      Anemia, likely chronic     Latest Ref Rng & Units 06/19/2021    2:26 PM 04/03/2019    3:00 AM 04/02/2019    2:22 PM  CMP  Glucose 70 - 99 mg/dL 04/04/2019  476  546   BUN 6 - 23 mg/dL 11  17  18    Creatinine 0.40 - 1.20 mg/dL 503   5.46   Sodium 135 - 145 mEq/L 142  139  139   Potassium 3.5 - 5.1 mEq/L 3.6  3.8  3.9   Chloride 96 - 112 mEq/L 105  101  102   CO2 19 - 32 mEq/L 27  26    Calcium 8.4 - 10.5 mg/dL 9.9  9.0    Total Protein 6.0 - 8.3 g/dL 7.6     Total Bilirubin 0.2 - 1.2 mg/dL 0.3     Alkaline Phos 39 - 117 U/L 75     AST 0 - 37 U/L 16     ALT 0 - 35 U/L 15      CBC  and CMET on 06/19/21 reviewed and normal lytes and enzymes     Assessment & Plan:   Discussion: 61 year old female with sarcoid, cryptogenic stroke with residual right hemiparesis, s/p left prosthetic eye globe, DM2 and HTN who presents for follow-up. History limited due to patient's dysarthria. Patient declined for additional family members to be involved in interview. Pulmonary sarcoid  stable. No immunosuppressant therapy indicated. Followed by [facility] for history of sarcoid uveitis in right eye.   We discussed the clinical course of sarcoid and management including serial PFTs, labs, eye exam, and EKG and chest imaging if indicated. If symptoms suggest sarcoid flare in the future, we would manage with steroids +/- biologics.  Pulmonary sarcoidosis with restrictive defect - Dx in 05/2012 with ocular sarcoid - No indication for prednisone therapy - Annual PFTs.  Due 06/2022 - Annual ophthalmology exam.   - QuantiFERON-TB needed if planning to initiate immunosuppressants in the future - Routine labs at next visit  Ocular Sarcoid --Followed at [facility]. Last visit on 08/2021. Following up next week  Health Maintenance  There is no immunization history on file for this patient. CT Lung Screen - not qualified. Quit >15 years  Orders Placed This Encounter  Procedures   Pulmonary function test    Standing Status:   Future    Standing Expiration Date:   12/30/2022    Scheduling Instructions:     Schedule at 89mo f/u    Order Specific Question:   Where should this test be performed?    Answer:   [facility]    Order Specific Question:   Full PFT: includes the following: basic spirometry, spirometry pre & post bronchodilator, diffusion capacity (DLCO), lung volumes    Answer:   Full PFT  No orders of the defined types were placed in this encounter.   Return in about 6 months (around 06/29/2022). After PFTs  I have spent a total time of 32-minutes on the day of the appointment  including chart review, data review, collecting history, coordinating care and discussing medical diagnosis and plan with the patient/family. Past medical history, allergies, medications were reviewed. Pertinent imaging, labs and tests included in this note have been reviewed and interpreted independently by me.  Myosha Cuadras Mechele Collin, MD [place] [facility] 01/04/2022  Office Number [phone]

## 2022-01-04 ENCOUNTER — Encounter: Payer: Self-pay | Admitting: Pulmonary Disease

## 2022-01-04 NOTE — Progress Notes (Signed)
Carelink Summary Report / Loop Recorder 

## 2022-01-17 LAB — CUP PACEART REMOTE DEVICE CHECK
Date Time Interrogation Session: 20231009231254
Implantable Pulse Generator Implant Date: 20210105

## 2022-01-22 ENCOUNTER — Ambulatory Visit (INDEPENDENT_AMBULATORY_CARE_PROVIDER_SITE_OTHER): Payer: Medicare (Managed Care)

## 2022-01-22 DIAGNOSIS — I639 Cerebral infarction, unspecified: Secondary | ICD-10-CM | POA: Diagnosis not present

## 2022-02-08 NOTE — Progress Notes (Signed)
Carelink Summary Report / Loop Recorder 

## 2022-02-26 ENCOUNTER — Ambulatory Visit (INDEPENDENT_AMBULATORY_CARE_PROVIDER_SITE_OTHER): Payer: Medicare (Managed Care)

## 2022-02-26 DIAGNOSIS — I639 Cerebral infarction, unspecified: Secondary | ICD-10-CM | POA: Diagnosis not present

## 2022-02-26 LAB — CUP PACEART REMOTE DEVICE CHECK
Date Time Interrogation Session: 20231119231356
Implantable Pulse Generator Implant Date: 20210105

## 2022-03-21 ENCOUNTER — Other Ambulatory Visit: Payer: Self-pay

## 2022-03-21 DIAGNOSIS — D860 Sarcoidosis of lung: Secondary | ICD-10-CM

## 2022-03-21 IMAGING — DX DG CHEST 2V
2 series · 2 of 2 positions shown · non-contrast
Comparison: X-ray chest 04/02/2019.

CLINICAL DATA: Follow up sarcoidosis

EXAM:
CHEST - 2 VIEW

[chest pa]
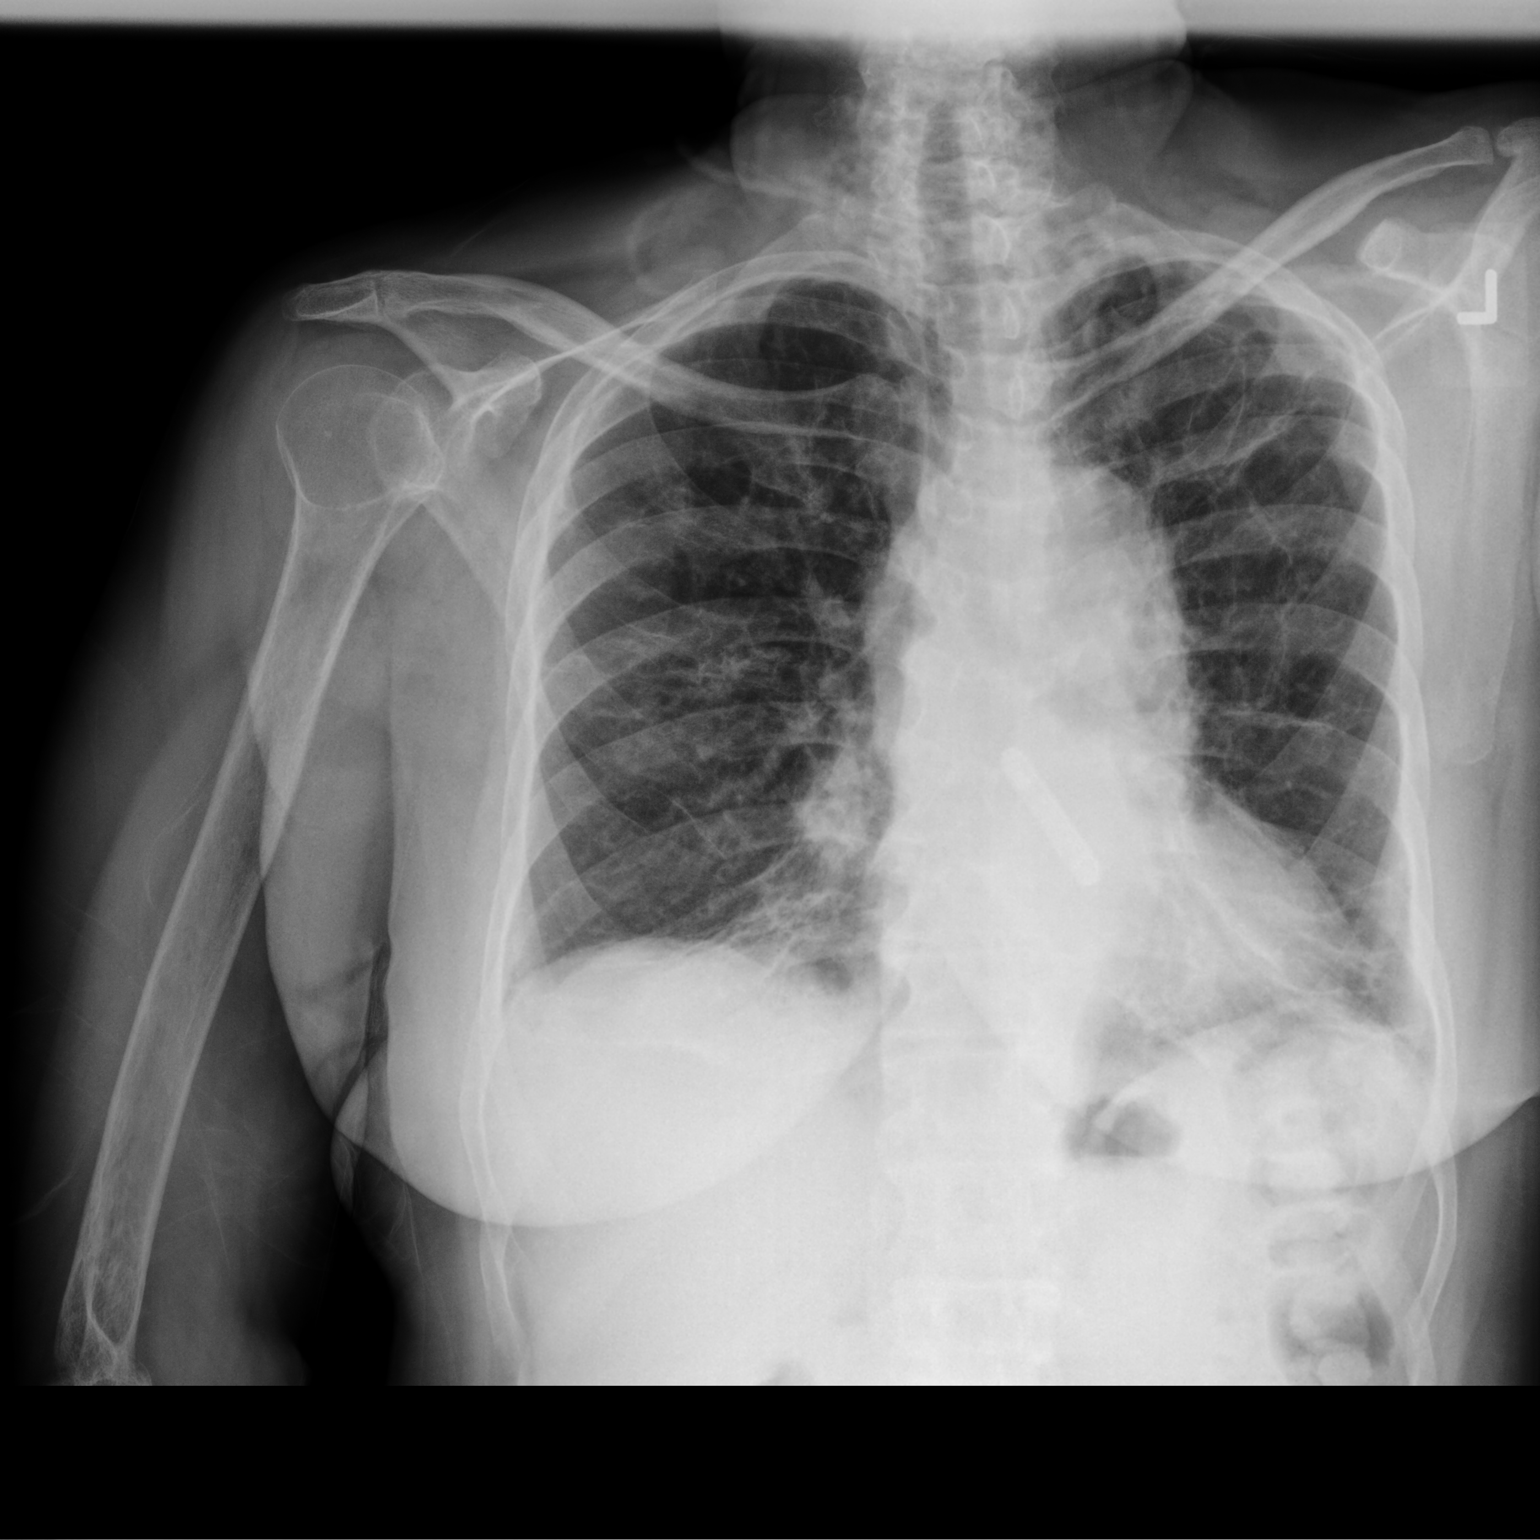

[chest lat]
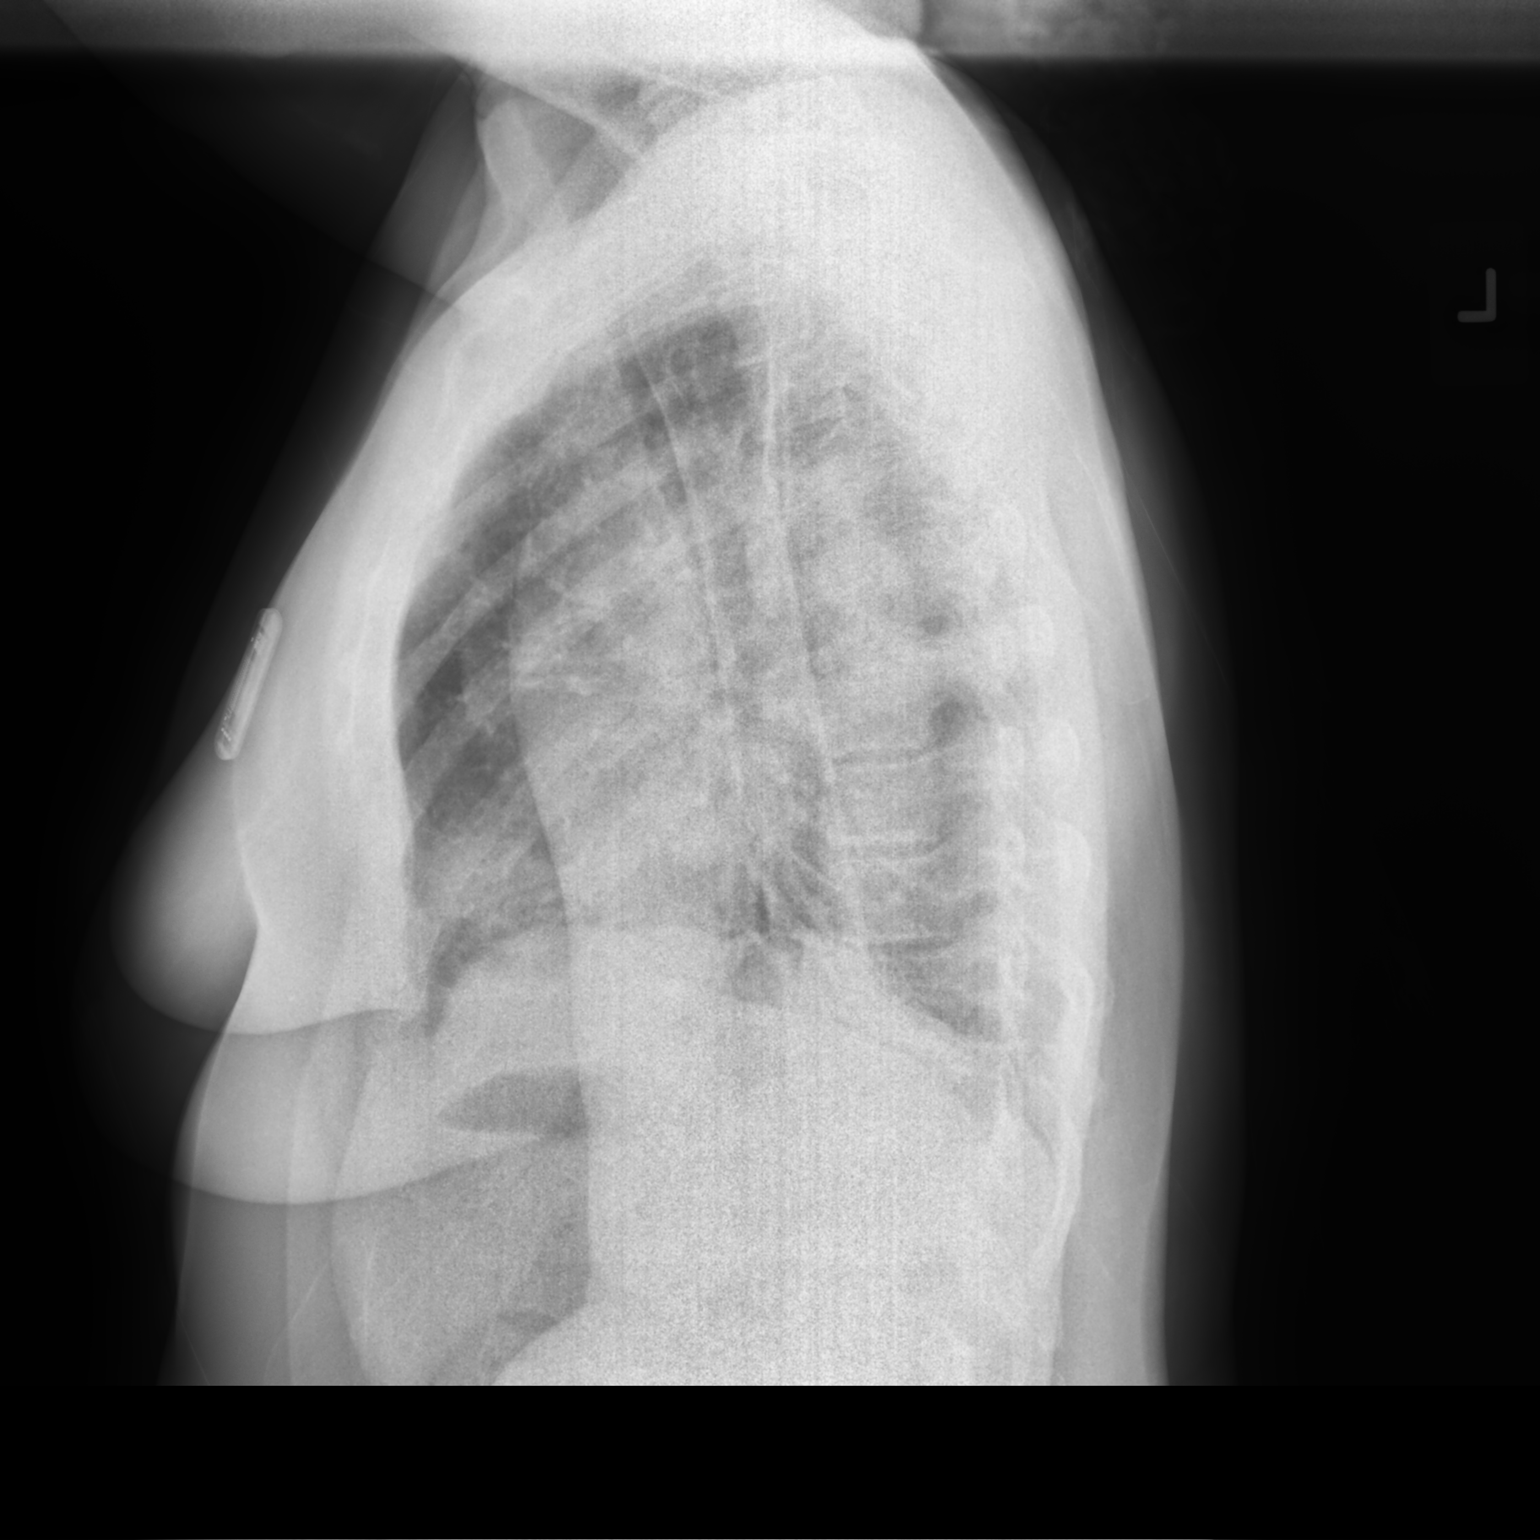

[2 of 2 positions shown; findings below may reference images not displayed]

FINDINGS: Heart size and mediastinal contours are unremarkable. Loop recorder
is identified in the projection of the heart. Lung volumes are
decreased. Progressive, chronic reticular interstitial opacities
with scattered areas of parenchymal scarring are again noted
compatible with the history of pulmonary sarcoid. No superimposed
airspace consolidation, pleural effusion or edema.
IMPRESSION: 1. Progressive, chronic parenchymal scarring and reticular
interstitial opacities compatible with the history of sarcoid.
2. No acute cardiopulmonary abnormalities noted.

## 2022-03-27 NOTE — Progress Notes (Signed)
Carelink Summary Report / Loop Recorder 

## 2022-04-03 ENCOUNTER — Ambulatory Visit (INDEPENDENT_AMBULATORY_CARE_PROVIDER_SITE_OTHER): Payer: Medicare (Managed Care)

## 2022-04-03 DIAGNOSIS — I639 Cerebral infarction, unspecified: Secondary | ICD-10-CM

## 2022-04-03 LAB — CUP PACEART REMOTE DEVICE CHECK
Date Time Interrogation Session: 20231225231534
Implantable Pulse Generator Implant Date: 20210105

## 2022-04-25 ENCOUNTER — Ambulatory Visit
Admission: RE | Admit: 2022-04-25 | Discharge: 2022-04-25 | Disposition: A | Discharge status: Home / Self Care | Payer: Medicare (Managed Care) | Source: Ambulatory Visit

## 2022-04-25 DIAGNOSIS — D860 Sarcoidosis of lung: Secondary | ICD-10-CM

## 2022-04-25 NOTE — Progress Notes (Signed)
[facility] Summary Report / Loop Recorder

## 2022-05-07 ENCOUNTER — Ambulatory Visit: Payer: Medicare (Managed Care) | Attending: Cardiology

## 2022-05-07 DIAGNOSIS — I639 Cerebral infarction, unspecified: Secondary | ICD-10-CM

## 2022-05-08 LAB — CUP PACEART REMOTE DEVICE CHECK
Date Time Interrogation Session: 20240127230213
Implantable Pulse Generator Implant Date: 20210105

## 2022-05-31 ENCOUNTER — Encounter (HOSPITAL_BASED_OUTPATIENT_CLINIC_OR_DEPARTMENT_OTHER): Payer: Medicare (Managed Care)

## 2022-05-31 ENCOUNTER — Ambulatory Visit (HOSPITAL_BASED_OUTPATIENT_CLINIC_OR_DEPARTMENT_OTHER): Payer: Medicare (Managed Care) | Admitting: Pulmonary Disease

## 2022-06-08 ENCOUNTER — Ambulatory Visit (INDEPENDENT_AMBULATORY_CARE_PROVIDER_SITE_OTHER): Payer: Medicare (Managed Care) | Admitting: Pulmonary Disease

## 2022-06-08 ENCOUNTER — Encounter (HOSPITAL_BASED_OUTPATIENT_CLINIC_OR_DEPARTMENT_OTHER): Payer: Self-pay | Admitting: Pulmonary Disease

## 2022-06-08 VITALS — BP 110/60 | HR 76 | Ht 65.0 in | Wt 161.4 lb

## 2022-06-08 DIAGNOSIS — D860 Sarcoidosis of lung: Secondary | ICD-10-CM

## 2022-06-08 DIAGNOSIS — D869 Sarcoidosis, unspecified: Secondary | ICD-10-CM

## 2022-06-08 LAB — PULMONARY FUNCTION TEST
DL/VA % pred: 104 %
DL/VA: 4.37 ml/min/mmHg/L
DLCO cor % pred: 55 %
DLCO cor: 11.59 ml/min/mmHg
DLCO unc % pred: 54 %
DLCO unc: 11.44 ml/min/mmHg
FEF 25-75 Post: 1.96 L/sec
FEF 25-75 Pre: 1.48 L/sec
FEF2575-%Change-Post: 32 %
FEF2575-%Pred-Post: 81 %
FEF2575-%Pred-Pre: 61 %
FEV1-%Change-Post: 8 %
FEV1-%Pred-Post: 63 %
FEV1-%Pred-Pre: 58 %
FEV1-Post: 1.68 L
FEV1-Pre: 1.55 L
FEV1FVC-%Change-Post: 3 %
FEV1FVC-%Pred-Pre: 104 %
FEV6-%Change-Post: 4 %
FEV6-%Pred-Post: 59 %
FEV6-%Pred-Pre: 57 %
FEV6-Post: 1.98 L
FEV6-Pre: 1.9 L
FEV6FVC-%Pred-Post: 103 %
FEV6FVC-%Pred-Pre: 103 %
FVC-%Change-Post: 4 %
FVC-%Pred-Post: 57 %
FVC-%Pred-Pre: 55 %
FVC-Post: 1.98 L
FVC-Pre: 1.9 L
Post FEV1/FVC ratio: 85 %
Post FEV6/FVC ratio: 100 %
Pre FEV1/FVC ratio: 82 %
Pre FEV6/FVC Ratio: 100 %
RV % pred: 73 %
RV: 1.5 L
TLC % pred: 67 %
TLC: 3.51 L

## 2022-06-08 NOTE — Patient Instructions (Signed)
Full PFT Performed Today. 

## 2022-06-08 NOTE — Patient Instructions (Addendum)
Sarcoid Repeat pulmonary function tests in 6 months (Sept 2024) If worsening, will need to consider immunosuppression. Will contact daughter with patient's permission Routine labs: CBC with diff, CMET   Follow-up with me in 6 months with PFTs prior

## 2022-06-08 NOTE — Progress Notes (Signed)
Full PFT Performed Today. 

## 2022-06-08 NOTE — Progress Notes (Signed)
Subjective:   PATIENT ID: Kathleen George GENDER: female DOB: 18-May-1960, MRN: [id number]   HPI  Chief Complaint  Patient presents with   Follow-up    Pft results     Reason for Visit: Follow-up sarcoid  [name] is a 62 year old female with sarcoid, cryptogenic stroke with residual right hemiparesis, DM2, HTN, left prosthetic eye globe who presents follow-up sarcoid management.  History limited due to patient's stroke and health literacy. Patient declined for additional family members to be involved in interview.   Synopsis: She was previously seen by Dr. Melvyn Novas in Pulmonary clinic on 01/30/21 for sarcoid. She has a history of ocular involvement of sarcoid previously on steroids but has been off since 2020. Last appointment in November 14, 2020 at [facility] with Dr. 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 She denies any pulmonary issues, no shortness of breath, cough or wheezing. She currently lives with her daughter. Ambulates with a walker at baseline. She prefers to maintain as much independence as possible.  06/19/21 She completed PFTs. Denies shortness, cough or wheezing. Denies chest pain or lower extremity swelling.  12/29/21 Since our last visit she reports overall doing well. No cough, shortness of breath or wheezing. Denies fatigue, night sweats, unexplained fevers/chills. Unchanged visual blurriness and followed by [facility] for sarcoid related uveitis. Note from 5/31 reviewed.  Severe right uveitic glaucoma. Currently on eye drops.  Ambulates with wheelchair due to her right sided stroke.  06/08/22 Since last visit she reports she is doing well. Denies cough, shortness of breath or wheezing. She was seen by [facility] opthalmology  Social History: Former smoker. Quit in 2002.  Past Medical History:  Diagnosis Date   Diabetes mellitus without complication ([place])    Glaucoma    Hypercholesterolemia     Hypertension    Prosthetic eye globe    Stroke ([place])      Family History  Problem Relation Age of Onset   Hypertension Mother    Hypertension Father      Social History   Occupational History   Not on file  Tobacco Use   Smoking status: Former    Types: Cigarettes    Quit date: 2002    Years since quitting: 22.1   Smokeless tobacco: Never  Substance and Sexual Activity   Alcohol use: Not Currently   Drug use: Never   Sexual activity: Not on file    Allergies  Allergen Reactions   Penicillins Anaphylaxis    Did it involve swelling of the face/tongue/throat, SOB, or low BP? Yes Did it involve sudden or severe rash/hives, skin peeling, or any reaction on the inside of your mouth or nose? N/A Did you need to seek medical attention at a hospital or doctor's office? N/A When did it last happen? Child   If all above answers are "NO", may proceed with cephalosporin use.     Outpatient Medications Prior to Visit  Medication Sig Dispense Refill   aspirin EC 81 MG EC tablet Take 1 tablet (81 mg total) by mouth daily. 30 tablet 0   atorvastatin (LIPITOR) 80 MG tablet Take 1 tablet (80 mg total) by mouth daily at 6 PM. 30 tablet 0   dorzolamide-timolol (COSOPT) 22.3-6.8 MG/ML ophthalmic solution Place 1 drop into the right eye 2 (two) times daily.     erythromycin ophthalmic ointment Place 1 application into the left eye at bedtime.  liraglutide (VICTOZA) 18 MG/3ML SOPN Inject 1.2 mg into the skin every evening.     No facility-administered medications prior to visit.    Review of Systems  Constitutional:  Negative for chills, diaphoresis, fever, malaise/fatigue and weight loss.  HENT:  Negative for congestion.   Respiratory:  Negative for cough, hemoptysis, sputum production, shortness of breath and wheezing.   Cardiovascular:  Negative for chest pain, palpitations and leg swelling.     Objective:   Vitals:   06/08/22 1059  BP: 110/60  Pulse: 76  SpO2: 100%   Weight: 161 lb 6.4 oz (73.2 kg)  Height: '5\' 5"'$  (1.651 m)   SpO2: 100 % O2 Device: None (Room air)  Physical Exam: General: Chronically-appearing, no acute distress, wheelchair bound HENT: [place], AT Eyes: EOMI, no scleral icterus, left eye prosthesis Respiratory: Clear to auscultation bilaterally.  No crackles, wheezing or rales Cardiovascular: RRR, -M/R/G, no JVD Extremities:-Edema,-tenderness Neuro: AAO x4, CNII-XII grossly intact Psych: Pleasant mood. Word finding. Slurred/dysarthric   Data Reviewed:  Imaging: CXR 01/30/21 -Parenchymal scarring with reticular opacities bilaterally, mildly progressed compared to 03/2019  PFT: 06/19/21 FVC 1.94 (69%) FEV1 1.68 (76%) Ratio 87  TLC 59% DLCO 56% Interpretation: Mild restrictive defect with moderately reduced gas exchange  06/08/22 FVC 1.98 (57%) FEV1 1.68 (63%) Ratio 85  TLC 67% DLCO 54%. No significant BD response Interpretation: Moderate restrictive defect with moderately reduced gas exchange  Labs: CBC    Component Value Date/Time   WBC 9.5 06/19/2021 1426   RBC 3.87 06/19/2021 1426   HGB 11.8 (L) 06/19/2021 1426   HCT 34.9 (L) 06/19/2021 1426   PLT 333.0 06/19/2021 1426   MCV 90.3 06/19/2021 1426   MCH 30.0 04/03/2019 0300   MCHC 33.7 06/19/2021 1426   RDW 13.6 06/19/2021 1426   LYMPHSABS 3.0 06/19/2021 1426   MONOABS 0.7 06/19/2021 1426   EOSABS 0.1 06/19/2021 1426   BASOSABS 0.0 06/19/2021 1426      Latest Ref Rng & Units 06/19/2021    2:26 PM 04/03/2019    3:00 AM 04/02/2019    2:22 PM  CBC  WBC 4.0 - 10.5 K/uL 9.5  9.2    Hemoglobin 12.0 - 15.0 g/dL 11.8  13.7  14.6   Hematocrit 36.0 - 46.0 % 34.9  40.4  43.0   Platelets 150.0 - 400.0 K/uL 333.0  382      Anemia, likely chronic     Latest Ref Rng & Units 06/19/2021    2:26 PM 04/03/2019    3:00 AM 04/02/2019    2:22 PM  CMP  Glucose 70 - 99 mg/dL 131  165  168   BUN 6 - 23 mg/dL '11  17  18   '$ Creatinine 0.40 - 1.20 mg/dL 0.63  0.70  0.70   Sodium  135 - 145 mEq/L 142  139  139   Potassium 3.5 - 5.1 mEq/L 3.6  3.8  3.9   Chloride 96 - 112 mEq/L 105  101  102   CO2 19 - 32 mEq/L 27  26    Calcium 8.4 - 10.5 mg/dL 9.9  9.0    Total Protein 6.0 - 8.3 g/dL 7.6     Total Bilirubin 0.2 - 1.2 mg/dL 0.3     Alkaline Phos 39 - 117 U/L 75     AST 0 - 37 U/L 16     ALT 0 - 35 U/L 15      CBC and CMET on 06/19/21 reviewed and normal  lytes and enzymes     Assessment & Plan:   Discussion: 62 year old female with sarcoid, cryptogenic stroke with residual right hemiparesis, s/p left prosthetic eye globe, DM2 and HTN who presents for follow-up. History limited due to patient's dysarthria. Patient declined for additional family members to be involved in interview. Pulmonary sarcoid with worsening restrictive defect however this may be effort related.  We discussed the clinical course of sarcoid and management including serial PFTs, labs, eye exam, and EKG and chest imaging if indicated. If symptoms suggest sarcoid flare in the future, we would manage with steroids +/- biologics.  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 Repeat pulmonary function tests in 6 months (Sept 2024) - If worsening, will need to consider immunosuppression. Will contact daughter with patient's permission - Annual PFTs. Repeat in Sept 2024 - Annual ophthalmology exam. Discuss with Arty Baumgartner regarding active/stable disease - QuantiFERON-TB needed if planning to initiate immunosuppressants in the future - Routine labs: CBC with diff, CMET  Ocular Sarcoid --Follow[facility] at [facility]. Last visit on 05/2022. Stable IOP on Cosopt  Health Maintenance  There is no immunization history on file for this patient. CT Lung Screen - not qualified. Quit >15 years  Orders Placed This Encounter  Procedures   CBC With Differential   Basic Metabolic Panel (BMET)   Pulmonary function test    Standing  Status:   Future    Standing Expiration Date:   06/08/2023    Scheduling Instructions:     Schedule in 6 months ( Sept 2024)    Order Specific Question:   Where should this test be performed?    Answer:   [facility]    Order Specific Question:   Full PFT: includes the following: basic spirometry, spirometry pre & post bronchodilator, diffusion capacity (DLCO), lung volumes    Answer:   Full PFT  No orders of the defined types were placed in this encounter.   Return in about 6 months (around 12/09/2022).   I have spent a total time of 34-minutes on the day of the appointment including chart review, data review, collecting history, coordinating care and discussing medical diagnosis and plan with the patient/family. Past medical history, allergies, medications were reviewed. Pertinent imaging, labs and tests included in this note have been reviewed and interpreted independently by me.  Brinson, MD [facility] 06/08/2022  Office Number [phone]

## 2022-06-09 LAB — CBC WITH DIFFERENTIAL
Basophils Absolute: 0 10*3/uL (ref 0.0–0.2)
Basos: 0 %
EOS (ABSOLUTE): 0.1 10*3/uL (ref 0.0–0.4)
Eos: 1 %
Hematocrit: 39.1 % (ref 34.0–46.6)
Hemoglobin: 12.7 g/dL (ref 11.1–15.9)
Immature Grans (Abs): 0 10*3/uL (ref 0.0–0.1)
Immature Granulocytes: 1 %
Lymphocytes Absolute: 3.2 10*3/uL — ABNORMAL HIGH (ref 0.7–3.1)
Lymphs: 41 %
MCH: 28.8 pg (ref 26.6–33.0)
MCHC: 32.5 g/dL (ref 31.5–35.7)
MCV: 89 fL (ref 79–97)
Monocytes Absolute: 0.8 10*3/uL (ref 0.1–0.9)
Monocytes: 10 %
Neutrophils Absolute: 3.7 10*3/uL (ref 1.4–7.0)
Neutrophils: 47 %
RBC: 4.41 x10E6/uL (ref 3.77–5.28)
RDW: 12.3 % (ref 11.7–15.4)
WBC: 7.8 10*3/uL (ref 3.4–10.8)

## 2022-06-09 LAB — BASIC METABOLIC PANEL
BUN/Creatinine Ratio: 17 (ref 12–28)
BUN: 13 mg/dL (ref 8–27)
CO2: 25 mmol/L (ref 20–29)
Calcium: 10 mg/dL (ref 8.7–10.3)
Chloride: 105 mmol/L (ref 96–106)
Creatinine, Ser: 0.78 mg/dL (ref 0.57–1.00)
Glucose: 99 mg/dL (ref 70–99)
Potassium: 4.8 mmol/L (ref 3.5–5.2)
Sodium: 145 mmol/L — ABNORMAL HIGH (ref 134–144)
eGFR: 87 mL/min/{1.73_m2} (ref >59)

## 2022-06-11 ENCOUNTER — Ambulatory Visit (INDEPENDENT_AMBULATORY_CARE_PROVIDER_SITE_OTHER): Payer: Medicare (Managed Care)

## 2022-06-11 DIAGNOSIS — I639 Cerebral infarction, unspecified: Secondary | ICD-10-CM

## 2022-06-11 LAB — CUP PACEART REMOTE DEVICE CHECK
Date Time Interrogation Session: 20240229231123
Implantable Pulse Generator Implant Date: 20210105

## 2022-06-19 NOTE — Progress Notes (Signed)
Carelink Summary Report / Loop Recorder 

## 2022-07-02 ENCOUNTER — Other Ambulatory Visit: Payer: Self-pay

## 2022-07-02 DIAGNOSIS — N6310 Unspecified lump in the right breast, unspecified quadrant: Secondary | ICD-10-CM

## 2022-07-02 DIAGNOSIS — N644 Mastodynia: Secondary | ICD-10-CM

## 2022-07-09 ENCOUNTER — Ambulatory Visit: Admission: RE | Admit: 2022-07-09 | Payer: Medicare (Managed Care) | Source: Ambulatory Visit

## 2022-07-09 ENCOUNTER — Ambulatory Visit
Admission: RE | Admit: 2022-07-09 | Discharge: 2022-07-09 | Disposition: A | Discharge status: Home / Self Care | Payer: Medicare (Managed Care) | Source: Ambulatory Visit

## 2022-07-09 DIAGNOSIS — N6310 Unspecified lump in the right breast, unspecified quadrant: Secondary | ICD-10-CM

## 2022-07-09 DIAGNOSIS — N644 Mastodynia: Secondary | ICD-10-CM

## 2022-07-16 ENCOUNTER — Ambulatory Visit (INDEPENDENT_AMBULATORY_CARE_PROVIDER_SITE_OTHER): Payer: Medicare (Managed Care)

## 2022-07-16 DIAGNOSIS — I639 Cerebral infarction, unspecified: Secondary | ICD-10-CM | POA: Diagnosis not present

## 2022-07-17 LAB — CUP PACEART REMOTE DEVICE CHECK
Date Time Interrogation Session: 20240407231334
Implantable Pulse Generator Implant Date: 20210105

## 2022-07-17 NOTE — Progress Notes (Signed)
Carelink Summary Report / Loop Recorder 

## 2022-08-20 ENCOUNTER — Ambulatory Visit (INDEPENDENT_AMBULATORY_CARE_PROVIDER_SITE_OTHER): Payer: Medicare (Managed Care)

## 2022-08-20 DIAGNOSIS — I639 Cerebral infarction, unspecified: Secondary | ICD-10-CM

## 2022-08-20 LAB — CUP PACEART REMOTE DEVICE CHECK
Date Time Interrogation Session: 20240510230849
Implantable Pulse Generator Implant Date: 20210105

## 2022-08-20 NOTE — Progress Notes (Signed)
Carelink Summary Report / Loop Recorder 

## 2022-09-12 NOTE — Progress Notes (Signed)
Carelink Summary Report / Loop Recorder 

## 2022-09-24 ENCOUNTER — Ambulatory Visit (INDEPENDENT_AMBULATORY_CARE_PROVIDER_SITE_OTHER): Payer: Medicare (Managed Care)

## 2022-09-24 DIAGNOSIS — I639 Cerebral infarction, unspecified: Secondary | ICD-10-CM

## 2022-09-24 LAB — CUP PACEART REMOTE DEVICE CHECK
Date Time Interrogation Session: 20240616231025
Implantable Pulse Generator Implant Date: 20210105

## 2022-10-12 NOTE — Progress Notes (Signed)
Carelink Summary Report / Loop Recorder 

## 2022-10-29 ENCOUNTER — Ambulatory Visit (INDEPENDENT_AMBULATORY_CARE_PROVIDER_SITE_OTHER): Payer: Medicare (Managed Care)

## 2022-10-29 DIAGNOSIS — I639 Cerebral infarction, unspecified: Secondary | ICD-10-CM

## 2022-10-29 LAB — CUP PACEART REMOTE DEVICE CHECK
Date Time Interrogation Session: 20240719230447
Implantable Pulse Generator Implant Date: 20210105

## 2022-11-07 NOTE — Progress Notes (Signed)
Carelink Summary Report / Loop Recorder 

## 2022-11-08 ENCOUNTER — Ambulatory Visit: Payer: Medicare (Managed Care)

## 2022-11-09 ENCOUNTER — Other Ambulatory Visit: Payer: Self-pay

## 2022-11-09 DIAGNOSIS — N6310 Unspecified lump in the right breast, unspecified quadrant: Secondary | ICD-10-CM

## 2022-11-09 DIAGNOSIS — N644 Mastodynia: Secondary | ICD-10-CM

## 2022-11-22 ENCOUNTER — Other Ambulatory Visit: Payer: Medicare (Managed Care)

## 2022-11-29 ENCOUNTER — Emergency Department (HOSPITAL_COMMUNITY): Payer: Medicare (Managed Care)

## 2022-11-29 ENCOUNTER — Other Ambulatory Visit: Payer: Self-pay

## 2022-11-29 ENCOUNTER — Emergency Department (HOSPITAL_COMMUNITY)
Admission: EM | Admit: 2022-11-29 | Discharge: 2022-11-29 | Disposition: A | Discharge status: Home / Self Care | Payer: Medicare (Managed Care) | Attending: Emergency Medicine | Admitting: Emergency Medicine

## 2022-11-29 DIAGNOSIS — R1032 Left lower quadrant pain: Secondary | ICD-10-CM | POA: Diagnosis present

## 2022-11-29 DIAGNOSIS — R109 Unspecified abdominal pain: Secondary | ICD-10-CM

## 2022-11-29 DIAGNOSIS — I10 Essential (primary) hypertension: Secondary | ICD-10-CM | POA: Diagnosis not present

## 2022-11-29 DIAGNOSIS — E119 Type 2 diabetes mellitus without complications: Secondary | ICD-10-CM | POA: Insufficient documentation

## 2022-11-29 LAB — COMPREHENSIVE METABOLIC PANEL
ALT: 17 U/L (ref 0–44)
AST: 14 U/L — ABNORMAL LOW (ref 15–41)
Albumin: 3.6 g/dL (ref 3.5–5.0)
Alkaline Phosphatase: 105 U/L (ref 38–126)
Anion gap: 8 (ref 5–15)
BUN: 12 mg/dL (ref 6–20)
CO2: 26 mmol/L (ref 22–32)
Calcium: 9.4 mg/dL (ref 8.9–10.3)
Chloride: 104 mmol/L (ref 98–111)
Creatinine, Ser: 0.74 mg/dL (ref 0.44–1.00)
GFR, Estimated: >60 mL/min (ref >60)
Glucose, Bld: 147 mg/dL — ABNORMAL HIGH (ref 70–99)
Potassium: 3.6 mmol/L (ref 3.5–5.1)
Sodium: 138 mmol/L (ref 135–145)
Total Bilirubin: 0.3 mg/dL (ref 0.3–1.2)
Total Protein: 8 g/dL (ref 6.5–8.1)

## 2022-11-29 LAB — CBC WITH DIFFERENTIAL/PLATELET
Abs Immature Granulocytes: 0.04 10*3/uL (ref 0.00–0.07)
Basophils Absolute: 0.1 10*3/uL (ref 0.0–0.1)
Basophils Relative: 1 %
Eosinophils Absolute: 0.2 10*3/uL (ref 0.0–0.5)
Eosinophils Relative: 2 %
HCT: 37.5 % (ref 36.0–46.0)
Hemoglobin: 12.5 g/dL (ref 12.0–15.0)
Immature Granulocytes: 0 %
Lymphocytes Relative: 43 %
Lymphs Abs: 3.9 10*3/uL (ref 0.7–4.0)
MCH: 29.9 pg (ref 26.0–34.0)
MCHC: 33.3 g/dL (ref 30.0–36.0)
MCV: 89.7 fL (ref 80.0–100.0)
Monocytes Absolute: 0.9 10*3/uL (ref 0.1–1.0)
Monocytes Relative: 9 %
Neutro Abs: 4.1 10*3/uL (ref 1.7–7.7)
Neutrophils Relative %: 45 %
Platelets: 324 10*3/uL (ref 150–400)
RBC: 4.18 MIL/uL (ref 3.87–5.11)
RDW: 12.4 % (ref 11.5–15.5)
WBC: 9.2 10*3/uL (ref 4.0–10.5)
nRBC: 0 % (ref 0.0–0.2)

## 2022-11-29 LAB — URINALYSIS, ROUTINE W REFLEX MICROSCOPIC
Bilirubin Urine: NEGATIVE
Glucose, UA: NEGATIVE mg/dL
Hgb urine dipstick: NEGATIVE
Ketones, ur: NEGATIVE mg/dL
Leukocytes,Ua: NEGATIVE
Nitrite: NEGATIVE
Protein, ur: NEGATIVE mg/dL
Specific Gravity, Urine: 1.006 (ref 1.005–1.030)
pH: 6 (ref 5.0–8.0)

## 2022-11-29 LAB — CUP PACEART REMOTE DEVICE CHECK
Date Time Interrogation Session: 20240821230913
Implantable Pulse Generator Implant Date: 20210105

## 2022-11-29 LAB — I-STAT CG4 LACTIC ACID, ED: Lactic Acid, Venous: 1.8 mmol/L (ref 0.5–1.9)

## 2022-11-29 LAB — LIPASE, BLOOD: Lipase: 57 U/L — ABNORMAL HIGH (ref 11–51)

## 2022-11-29 MED ORDER — IOHEXOL 300 MG/ML  SOLN
100.0000 mL | Freq: Once | INTRAMUSCULAR | Status: AC | PRN
  Administered 2022-11-29: 100 mL via INTRAVENOUS

## 2022-11-29 MED ORDER — POLYETHYLENE GLYCOL 3350 17 G PO PACK: 17.0000 g | PACK | Freq: Every day | ORAL | 0 refills | Status: AC

## 2022-11-29 MED ORDER — LACTATED RINGERS IV BOLUS
1000.0000 mL | Freq: Once | INTRAVENOUS | Status: AC
  Administered 2022-11-29: 1000 mL via INTRAVENOUS

## 2022-11-29 MED ORDER — FENTANYL CITRATE PF 50 MCG/ML IJ SOSY
100.0000 ug | PREFILLED_SYRINGE | Freq: Once | INTRAMUSCULAR | Status: AC
  Administered 2022-11-29: 100 ug via INTRAVENOUS
  Filled 2022-11-29: qty 2

## 2022-11-29 MED ORDER — ONDANSETRON HCL 4 MG/2ML IJ SOLN
4.0000 mg | Freq: Once | INTRAMUSCULAR | Status: AC
  Administered 2022-11-29: 4 mg via INTRAVENOUS
  Filled 2022-11-29: qty 2

## 2022-11-29 NOTE — ED Provider Notes (Signed)
[place] EMERGENCY DEPARTMENT AT [facility] Provider Note   CSN: [id number] Arrival date & time: 11/29/22  1500     History Chief Complaint  Patient presents with   Abdominal Pain    HPI Kathleen George is a 62 y.o. female presenting for chief complaint abdominal pain.  Extensive medical history.  States that the left lower quadrant nature radiates into the right back.  Denies fevers chills nausea vomiting shortness of breath..  Hx of CVA, HLD, HTN, DM per external chart. Patient's recorded medical, surgical, social, medication list and allergies were reviewed in the Snapshot window as part of the initial history.   Review of Systems   Review of Systems  Constitutional:  Negative for chills and fever.  HENT:  Negative for ear pain and sore throat.   Eyes:  Negative for pain and visual disturbance.  Respiratory:  Negative for cough and shortness of breath.   Cardiovascular:  Negative for chest pain and palpitations.  Gastrointestinal:  Positive for abdominal pain and nausea. Negative for vomiting.  Genitourinary:  Negative for dysuria and hematuria.  Musculoskeletal:  Negative for arthralgias and back pain.  Skin:  Negative for color change and rash.  Neurological:  Negative for seizures and syncope.  All other systems reviewed and are negative.   Physical Exam Updated Vital Signs BP (!) 130/98 (BP Location: Right Arm)   Pulse 89   Temp 98.4 F (36.9 C) (Oral)   Resp 18   Ht 5\' 3"  (1.6 m)   Wt 74.8 kg   SpO2 100%   BMI 29.23 kg/m  Physical Exam Vitals and nursing note reviewed.  Constitutional:      General: She is not in acute distress.    Appearance: She is well-developed.  HENT:     Head: Normocephalic and atraumatic.  Eyes:     Conjunctiva/sclera: Conjunctivae normal.  Cardiovascular:     Rate and Rhythm: Normal rate and regular rhythm.     Heart sounds: No murmur heard. Pulmonary:     Effort: Pulmonary effort is normal. No respiratory  distress.     Breath sounds: Normal breath sounds.  Abdominal:     General: There is no distension.     Palpations: Abdomen is soft.     Tenderness: There is no abdominal tenderness. There is no right CVA tenderness or left CVA tenderness.  Musculoskeletal:        General: No swelling or tenderness. Normal range of motion.     Cervical back: Neck supple.  Skin:    General: Skin is warm and dry.  Neurological:     General: No focal deficit present.     Mental Status: She is alert and oriented to person, place, and time. Mental status is at baseline.     Cranial Nerves: No cranial nerve deficit.      ED Course/ Medical Decision Making/ A&P    Procedures Procedures   Medications Ordered in ED Medications  fentaNYL (SUBLIMAZE) injection 100 mcg (100 mcg Intravenous Given 11/29/22 1634)  ondansetron (ZOFRAN) injection 4 mg (4 mg Intravenous Given 11/29/22 1633)  lactated ringers bolus 1,000 mL (0 mLs Intravenous Stopped 11/29/22 1832)  iohexol (OMNIPAQUE) 300 MG/ML solution 100 mL (100 mLs Intravenous Contrast Given 11/29/22 1825)   Medical Decision Making:   Kathleen George is a 62 y.o. female who presented to the ED today with abdominal pain, detailed above.    Patient's presentation is complicated by their history of multiple comorbid medical conditions.  Patient placed on continuous vitals and telemetry monitoring while in ED which was reviewed periodically.  Complete initial physical exam performed, notably the patient  was hemodynamically stable no acute distress.     Reviewed and confirmed nursing documentation for past medical history, family history, social history.    Initial Assessment:   With the patient's presentation of abdominal pain, most likely diagnosis is abdominal pain. Other diagnoses were considered including (but not limited to) gastroenteritis, colitis, small bowel obstruction, appendicitis, cholecystitis, pancreatitis, nephrolithiasis, UTI, pyleonephritis,  ruptured ectopic pregnancy, PID, ovarian torsion. These are considered less likely due to history of present illness and physical exam findings.   This is most consistent with an acute life/limb threatening illness complicated by underlying chronic conditions.   Initial Plan:  CBC/CMP to evaluate for underlying infectious/metabolic etiology for patient's abdominal pain  Lipase to evaluate for pancreatitis  EKG to evaluate for cardiac source of pain  CTAB/Pelvis with contrast to evaluate for structural/surgical etiology of patients' severe abdominal pain.  Urinalysis and repeat physical assessment to evaluate for UTI/Pyelonpehritis  Empiric management of symptoms with escalating pain control and antiemetics as needed.   Initial Study Results:   Laboratory  All laboratory results reviewed without evidence of clinically relevant pathology.    EKG EKG was reviewed independently. Rate, rhythm, axis, intervals all examined and without medically relevant abnormality. ST segments without concerns for elevations.    Radiology All images reviewed independently. Agree with radiology report at this time.   CT ABDOMEN PELVIS W CONTRAST  Result Date: 11/29/2022 CLINICAL DATA:  Right-sided abdominal pain for 2 days. History of sarcoidosis. EXAM: CT ABDOMEN AND PELVIS WITH CONTRAST TECHNIQUE: Multidetector CT imaging of the abdomen and pelvis was performed using the standard protocol following bolus administration of intravenous contrast. RADIATION DOSE REDUCTION: This exam was performed according to the departmental dose-optimization program which includes automated exposure control, adjustment of the mA and/or kV according to patient size and/or use of iterative reconstruction technique. CONTRAST:  OMNIPAQUE IOHEXOL 300 MG/ML  SOLN COMPARISON:  Noncontrast chest CT 04/25/2022. FINDINGS: Lower chest: Heart is enlarged. Mild elevation of the right hemidiaphragm. Ground-glass interstitial changes  identified along bases. Distribution is similar to the prior chest CT. Please correlate with the history. There is also abnormal nodes identified in the mediastinum and visualized portions of the hilum consistent with the history of sarcoidosis and unchanged from previous. Slight wall thickening of the distal esophagus. Hepatobiliary: Fatty liver infiltration. Patent portal vein. Gallbladder is present. Pancreas: Preserved pancreatic parenchyma. Patent portal vein. There is a 5 mm cystic lesion in the tail of the pancreas on series 2, image 30. This appears to be macroscopic fat on coronal series 8, image 72. No specific follow-up. Spleen: Normal in size without focal abnormality. Adrenals/Urinary Tract: Stable thickening of the left adrenal gland. Nonspecific. Right adrenal gland is preserved. No enhancing renal mass or collecting system dilatation. The ureters have normal course and caliber. Preserved contours of the urinary bladder. There are some tiny low-attenuation lesions along each kidney which are too small to completely characterize but likely benign cysts. Bosniak 1 and 2 lesions. No specific imaging follow-up. Stomach/Bowel: No oral contrast. There is some fluid and debris in the stomach. Small bowel is nondilated. Large bowel has a normal course and caliber with scattered moderate colonic stool. Left-sided colonic diverticula. Normal appendix. Vascular/Lymphatic: Aortic atherosclerosis. Normal caliber aorta and IVC. There are some scattered prominent but not pathologic nodes identified including gastrohepatic ligament and pelvic sidewall. Again  less than a cm in short axis and not pathologic by size criteria. Reproductive: Status post hysterectomy. No adnexal masses. Other: No free air or free fluid. Musculoskeletal: No acute or significant osseous findings. Known loop recorder. IMPRESSION: Diffuse colonic stool. Left-sided colonic diverticula. Normal appendix. Fatty liver infiltration. Lung changes  again identified with abnormal lymph nodes. Please correlate for known history of sarcoidosis. Electronically Signed   By: Karen Kays M.D.   On: 11/29/2022 19:22   CUP PACEART REMOTE DEVICE CHECK  Result Date: 11/29/2022 ILR summary report received. Battery status OK. Normal device function. No new symptom, tachy, brady, or pause episodes. No new AF episodes. Monthly summary reports and ROV/PRN. MC, CVRS.   Final Reassessment and Plan:   I was called back to bedside as patient is requesting discharge. States all of her symptoms have resolved after pain medication.  She is been able to tolerate p.o. intake.  Family is arrived and is planning to take patient home.  She does have substantial stool burden on her CT scan.  Will place on MiraLAX twice daily recommend return precautions if symptoms recurrent but given patient's desire for outpatient management this is reasonable at this time with this negative workup.  Strict return precautions reinforced.     Clinical Impression:  1. Abdominal pain, unspecified abdominal location      Discharge   Final Clinical Impression(s) / ED Diagnoses Final diagnoses:  Abdominal pain, unspecified abdominal location    Rx / DC Orders ED Discharge Orders          Ordered    polyethylene glycol (MIRALAX) 17 g packet  Daily        11/29/22 Maryjean Morn, MD 11/29/22 2208

## 2022-11-29 NOTE — ED Triage Notes (Signed)
Patient brought in from home by EMS with c/o ABD pain. R ABD pain x2 days ago. Patient states she can have BM, no blood just runny, voices not being able to go since. She also c/o spasms in R leg. He has HX of stroke. Denies N/V/D, no change in diet. Right side is restricted.  124/82 94 18 97% RA CBG:124

## 2022-12-03 ENCOUNTER — Ambulatory Visit (INDEPENDENT_AMBULATORY_CARE_PROVIDER_SITE_OTHER): Payer: Medicare (Managed Care)

## 2022-12-03 DIAGNOSIS — I639 Cerebral infarction, unspecified: Secondary | ICD-10-CM

## 2022-12-04 ENCOUNTER — Emergency Department (HOSPITAL_COMMUNITY): Payer: Medicare (Managed Care)

## 2022-12-04 ENCOUNTER — Encounter (HOSPITAL_COMMUNITY): Payer: Self-pay

## 2022-12-04 ENCOUNTER — Emergency Department (HOSPITAL_BASED_OUTPATIENT_CLINIC_OR_DEPARTMENT_OTHER)
Admit: 2022-12-04 | Discharge: 2022-12-04 | Disposition: A | Discharge status: Home / Self Care | Payer: Medicare (Managed Care)

## 2022-12-04 ENCOUNTER — Emergency Department (HOSPITAL_COMMUNITY)
Admission: EM | Admit: 2022-12-04 | Discharge: 2022-12-04 | Disposition: A | Discharge status: Home / Self Care | Payer: Medicare (Managed Care) | Attending: Emergency Medicine | Admitting: Emergency Medicine

## 2022-12-04 ENCOUNTER — Other Ambulatory Visit: Payer: Self-pay

## 2022-12-04 DIAGNOSIS — M79661 Pain in right lower leg: Secondary | ICD-10-CM | POA: Insufficient documentation

## 2022-12-04 DIAGNOSIS — M79604 Pain in right leg: Secondary | ICD-10-CM

## 2022-12-04 DIAGNOSIS — E119 Type 2 diabetes mellitus without complications: Secondary | ICD-10-CM | POA: Insufficient documentation

## 2022-12-04 DIAGNOSIS — Z794 Long term (current) use of insulin: Secondary | ICD-10-CM | POA: Insufficient documentation

## 2022-12-04 DIAGNOSIS — Z7982 Long term (current) use of aspirin: Secondary | ICD-10-CM | POA: Diagnosis not present

## 2022-12-04 DIAGNOSIS — M1990 Unspecified osteoarthritis, unspecified site: Secondary | ICD-10-CM

## 2022-12-04 LAB — COMPREHENSIVE METABOLIC PANEL
ALT: 17 U/L (ref 0–44)
AST: 17 U/L (ref 15–41)
Albumin: 3.6 g/dL (ref 3.5–5.0)
Alkaline Phosphatase: 101 U/L (ref 38–126)
Anion gap: 10 (ref 5–15)
BUN: 7 mg/dL (ref 6–20)
CO2: 26 mmol/L (ref 22–32)
Calcium: 9.2 mg/dL (ref 8.9–10.3)
Chloride: 101 mmol/L (ref 98–111)
Creatinine, Ser: 0.85 mg/dL (ref 0.44–1.00)
GFR, Estimated: >60 mL/min (ref >60)
Glucose, Bld: 118 mg/dL — ABNORMAL HIGH (ref 70–99)
Potassium: 4.1 mmol/L (ref 3.5–5.1)
Sodium: 137 mmol/L (ref 135–145)
Total Bilirubin: 0.3 mg/dL (ref 0.3–1.2)
Total Protein: 7.5 g/dL (ref 6.5–8.1)

## 2022-12-04 LAB — CBC WITH DIFFERENTIAL/PLATELET
Abs Immature Granulocytes: 0.04 10*3/uL (ref 0.00–0.07)
Basophils Absolute: 0 10*3/uL (ref 0.0–0.1)
Basophils Relative: 1 %
Eosinophils Absolute: 0.2 10*3/uL (ref 0.0–0.5)
Eosinophils Relative: 2 %
HCT: 39.6 % (ref 36.0–46.0)
Hemoglobin: 13 g/dL (ref 12.0–15.0)
Immature Granulocytes: 1 %
Lymphocytes Relative: 42 %
Lymphs Abs: 3.6 10*3/uL (ref 0.7–4.0)
MCH: 30.2 pg (ref 26.0–34.0)
MCHC: 32.8 g/dL (ref 30.0–36.0)
MCV: 91.9 fL (ref 80.0–100.0)
Monocytes Absolute: 0.7 10*3/uL (ref 0.1–1.0)
Monocytes Relative: 8 %
Neutro Abs: 4 10*3/uL (ref 1.7–7.7)
Neutrophils Relative %: 46 %
Platelets: 349 10*3/uL (ref 150–400)
RBC: 4.31 MIL/uL (ref 3.87–5.11)
RDW: 12.6 % (ref 11.5–15.5)
WBC: 8.6 10*3/uL (ref 4.0–10.5)
nRBC: 0 % (ref 0.0–0.2)

## 2022-12-04 MED ORDER — NAPROXEN 250 MG PO TABS
500.0000 mg | ORAL_TABLET | Freq: Once | ORAL | Status: AC
  Administered 2022-12-04: 500 mg via ORAL
  Filled 2022-12-04: qty 2

## 2022-12-04 MED ORDER — HYDROCODONE-ACETAMINOPHEN 5-325 MG PO TABS
1.0000 | ORAL_TABLET | Freq: Once | ORAL | Status: AC
  Administered 2022-12-04: 1 via ORAL
  Filled 2022-12-04: qty 1

## 2022-12-04 NOTE — ED Provider Notes (Signed)
   Accepted handoff at shift change from [facility]. Please see prior provider note for more detail.   Briefly: Patient is 62 y.o. "sudden right lower extremity pain that came on about 36 hours ago. She states all of a sudden she started having severe right calf pain, going to the knee, that is sharp and stabbing. It is worse with ambulation, feels like she cannot put any pressure on her foot, because it hurts so much. She denies any specifically foot pain, states is mostly in her calf and right knee. Denies any redness, swelling, trauma to the area. Has not had any fevers or chills."  DDX: concern for DVT, compartment syndrome, MSK, fracture/dislocations  Plan:  - dispo pending workup - Ultrasound without concern for DVT. Right leg/foot xray showing arthritis - no acute abnormalities. CBC without leukocytosis or anemia. CMP without electrolyte abnormalities. BUN/Cr within normal limits. Liver panel within normal limits. - Shared results with patient. Patient stating that pain is not associated with rest vs exertion - less concerning for severe PAD. Patient also with +2 pedal pulses appreciated by Small, PA-C and me as well. No skin changes. Area non-tense. Shared with patient that symptoms could be d/t strained muscle/tendon vs arthritis. Educated patient on pain management and following up with PCP. Provided educational handouts. Patient verbalized understanding of plan. - Patient afebrile with stable vitals. Provided with return precautions. Discharged in good condition.   Dorthy Cooler, [place] 12/04/22 1750    Lonell Grandchild, MD 12/05/22 [phone]

## 2022-12-04 NOTE — Discharge Instructions (Addendum)
Foot Xray:  Mild talonavicular osteoarthritis. Small plantar calcaneal heel spur.  Lower Leg Xray: No acute fracture or dislocation.  Knee Xray: Minimal patellofemoral osteoarthritis.   Ultrasound: no concern for DVT   It was a pleasure caring for you today.  No acute findings found on imaging or lab work today that would explain your symptoms.  I recommend following up with primary care provider in the next couple of days for reassessment of your leg pain.  If you do not have a primary care provider, I have provided information for a clinic in this discharge paperwork.  Seek emergency care if experiencing any new or worsening symptoms.  Alternating between 650 mg Tylenol and 400 mg Advil: The best way to alternate taking Acetaminophen (example Tylenol) and Ibuprofen (example Advil/Motrin) is to take them 3 hours apart. For example, if you take ibuprofen at 6 am you can then take Tylenol at 9 am. You can continue this regimen throughout the day, making sure you do not exceed the recommended maximum dose for each drug.

## 2022-12-04 NOTE — ED Triage Notes (Signed)
Pt bib PTAR from home with sudden onset last night of lower right knee to ankle pain, describes it as a cramping sensation. Per EMS, pt is not able to stand up. Previous stroke hx, deficit on right side. Hx of HTN, diabetes. Pt alert and oriented x4.    PTAR vital signs:  150/100 80s to upper 90s HR  98% room air  200 CBG

## 2022-12-04 NOTE — ED Provider Notes (Signed)
[place] EMERGENCY DEPARTMENT AT [facility] Provider Note   CSN: [id number] Arrival date & time: 12/04/22  1330     History  Chief Complaint  Patient presents with   Leg Pain    Kathleen George is a 62 y.o. female, history of cryptogenic stroke, uveitic glaucoma, diabetes, who presents to the ED secondary to sudden right lower extremity pain that came on about 36 hours ago.  She states all of a sudden she started having severe right calf pain, going to the knee, that is sharp and stabbing.  It is worse with ambulation, feels like she cannot put any pressure on her foot, because it hurts so much.  She denies any specifically foot pain, states is mostly in her calf and right knee.  Denies any redness, swelling, trauma to the area.  Has not had any fevers or chills.     Home Medications Prior to Admission medications   Medication Sig Start Date End Date Taking? Authorizing Provider  aspirin EC 81 MG EC tablet Take 1 tablet (81 mg total) by mouth daily. 04/04/19   Meccariello, Solmon Ice, MD  atorvastatin (LIPITOR) 80 MG tablet Take 1 tablet (80 mg total) by mouth daily at 6 PM. 04/03/19   Meccariello, Solmon Ice, MD  dorzolamide-timolol (COSOPT) 22.3-6.8 MG/ML ophthalmic solution Place 1 drop into the right eye 2 (two) times daily.    [provider]  erythromycin ophthalmic ointment Place 1 application into the left eye at bedtime.    [provider]  insulin glargine (LANTUS) 100 UNIT/ML injection Inject into the skin. 04/03/21   [provider]  levETIRAcetam (KEPPRA) 500 MG tablet Take by mouth. 04/03/21   [provider]  linagliptin (TRADJENTA) 5 MG TABS tablet Take by mouth. 06/25/19   [provider]  liraglutide (VICTOZA) 18 MG/3ML SOPN Inject 1.2 mg into the skin every evening.    [provider]  lisinopril (ZESTRIL) 20 MG tablet Take by mouth.    [provider]  polyethylene glycol (MIRALAX) 17 g packet Take  17 g by mouth daily. 11/29/22   Glyn Ade, MD      Allergies    Penicillins    Review of Systems   Review of Systems  Cardiovascular:  Negative for leg swelling.  Musculoskeletal:        +R leg pain    Physical Exam Updated Vital Signs BP 129/86   Pulse 95   Temp 97.7 F (36.5 C) (Oral)   Resp 18   Ht 5\' 3"  (1.6 m)   Wt 77.1 kg   BMI 30.11 kg/m  Physical Exam Vitals and nursing note reviewed.  Constitutional:      General: She is not in acute distress.    Appearance: She is well-developed.  HENT:     Head: Normocephalic and atraumatic.  Eyes:     Conjunctiva/sclera: Conjunctivae normal.     Comments: Prosthetic left eye  Cardiovascular:     Rate and Rhythm: Normal rate and regular rhythm.     Pulses:          Dorsalis pedis pulses are 2+ on the right side and 2+ on the left side.     Heart sounds: No murmur heard.    Comments: Tenderness to palpation of the posterior right calf, as well as posterior popliteal fossa.  No redness, warmth, or edema Pulmonary:     Effort: Pulmonary effort is normal. No respiratory distress.     Breath sounds: Normal  breath sounds.  Abdominal:     Palpations: Abdomen is soft.     Tenderness: There is no abdominal tenderness.  Musculoskeletal:        General: No swelling.     Cervical back: Neck supple.  Skin:    General: Skin is warm and dry.     Capillary Refill: Capillary refill takes less than 2 seconds.  Neurological:     Mental Status: She is alert.  Psychiatric:        Mood and Affect: Mood normal.     ED Results / Procedures / Treatments   Labs (all labs ordered are listed, but only abnormal results are displayed) Labs Reviewed  CBC WITH DIFFERENTIAL/PLATELET  COMPREHENSIVE METABOLIC PANEL    EKG None  Radiology DG Knee Complete 4 Views Right  Result Date: 12/04/2022 CLINICAL DATA:  Pain.  No known trauma. EXAM: RIGHT KNEE - COMPLETE 4+ VIEW COMPARISON:  Right knee radiographs 09/29/2021 FINDINGS:  There is diffuse decreased bone mineralization, similar to prior. Minimal superior patellar degenerative osteophytosis. No joint effusion. No acute fracture or dislocation. IMPRESSION: Minimal patellofemoral osteoarthritis. Electronically Signed   By: Neita Garnet M.D.   On: 12/04/2022 15:47   VAS [place] LOWER EXTREMITY VENOUS (DVT) (ONLY [facility] & [facility])  Result Date: 12/04/2022  Lower Venous DVT Study Patient Name:  Kathleen George  Date of Exam:   12/04/2022 Medical Rec #: [id number]     Accession #:    [id number] Date of Birth: Jan 14, 1961    Patient Gender: F Patient Age:   14 years Exam Location:  [facility] Procedure:      VAS [place] LOWER EXTREMITY VENOUS (DVT) Referring Phys: Nehemiah Settle Latha Staunton --------------------------------------------------------------------------------  Indications: Pain.  Comparison Study: No prior study on file Performing Technologist: Sherren Kerns RVS  Examination Guidelines: A complete evaluation includes B-mode imaging, spectral Doppler, color Doppler, and power Doppler as needed of all accessible portions of each vessel. Bilateral testing is considered an integral part of a complete examination. Limited examinations for reoccurring indications may be performed as noted. The reflux portion of the exam is performed with the patient in reverse Trendelenburg.  +---------+---------------+---------+-----------+----------+--------------+ RIGHT    CompressibilityPhasicitySpontaneityPropertiesThrombus Aging +---------+---------------+---------+-----------+----------+--------------+ CFV      Full           Yes      Yes                                 +---------+---------------+---------+-----------+----------+--------------+ SFJ      Full                                                        +---------+---------------+---------+-----------+----------+--------------+ FV Prox  Full                                                         +---------+---------------+---------+-----------+----------+--------------+ FV Mid   Full                                                        +---------+---------------+---------+-----------+----------+--------------+  FV DistalFull                                                        +---------+---------------+---------+-----------+----------+--------------+ PFV      Full                                                        +---------+---------------+---------+-----------+----------+--------------+ POP      Full           Yes      Yes                                 +---------+---------------+---------+-----------+----------+--------------+ PTV      Full                                                        +---------+---------------+---------+-----------+----------+--------------+ PERO     Full                                                        +---------+---------------+---------+-----------+----------+--------------+   +----+---------------+---------+-----------+----------+--------------+ LEFTCompressibilityPhasicitySpontaneityPropertiesThrombus Aging +----+---------------+---------+-----------+----------+--------------+ CFV Full           Yes      Yes                                 +----+---------------+---------+-----------+----------+--------------+     Summary: RIGHT: - There is no evidence of deep vein thrombosis in the lower extremity.  - No cystic structure found in the popliteal fossa.  LEFT: - No evidence of common femoral vein obstruction.   *See table(s) above for measurements and observations. Electronically signed by Waverly Ferrari MD on 12/04/2022 at 3:23:53 PM.    Final     Procedures Procedures    Medications Ordered in ED Medications  HYDROcodone-acetaminophen (NORCO/VICODIN) 5-325 MG per tablet 1 tablet (has no administration in time range)    ED Course/ Medical Decision Making/ A&P                                  Medical Decision Making Patient is a 62 year old female, here for right leg pain, for the last day and a half.  Has good pulses, no evidence of redness, or swelling.  Will rule out DVT, secondary to pain/cramping.  Will give her Norco for pain control.  As well as obtain x-rays to rule out any acute bony abnormality.  Will obtain electrolytes to evaluate for possible hypokalemia  Amount and/or Complexity of Data Reviewed Labs: ordered. Radiology: ordered.    Details: No evidence of DVT Discussion of management or test interpretation with external provider(s): Signed out to [facility], pending x-rays, blood work,  and reassessment of patient.  She does have good pulse, and does not have any skin changes on my exam.  Risk Prescription drug management.   Final Clinical Impression(s) / ED Diagnoses Final diagnoses:  None    Rx / DC Orders ED Discharge Orders     None         Starlette Thurow, Harley [place], PA 12/04/22 1556    Ernie Avena, MD 12/05/22 1418

## 2022-12-04 NOTE — Progress Notes (Signed)
VASCULAR LAB    Right lower extremity venous duplex has been performed.  See CV proc for preliminary results.  Messaged negative results to Dr. Karene Fry and Barnie Alderman, PA-C via secure chat  Rosezetta Schlatter, [facility], RVT 12/04/2022, 3:09 PM

## 2022-12-12 NOTE — Progress Notes (Signed)
Carelink Summary Report / Loop Recorder 

## 2022-12-21 NOTE — Progress Notes (Deleted)
Uses [facility] [phone]

## 2022-12-26 ENCOUNTER — Encounter (HOSPITAL_BASED_OUTPATIENT_CLINIC_OR_DEPARTMENT_OTHER): Payer: Self-pay | Admitting: Pulmonary Disease

## 2022-12-26 ENCOUNTER — Encounter (HOSPITAL_BASED_OUTPATIENT_CLINIC_OR_DEPARTMENT_OTHER): Payer: Medicare (Managed Care) | Admitting: Pulmonary Disease

## 2023-01-02 LAB — CUP PACEART REMOTE DEVICE CHECK
Date Time Interrogation Session: 20240923230648
Implantable Pulse Generator Implant Date: 20210105

## 2023-01-07 ENCOUNTER — Ambulatory Visit (INDEPENDENT_AMBULATORY_CARE_PROVIDER_SITE_OTHER): Payer: Medicare (Managed Care)

## 2023-01-07 DIAGNOSIS — I639 Cerebral infarction, unspecified: Secondary | ICD-10-CM | POA: Diagnosis not present

## 2023-01-18 NOTE — Progress Notes (Signed)
Carelink Summary Report / Loop Recorder 

## 2023-01-23 ENCOUNTER — Encounter (HOSPITAL_BASED_OUTPATIENT_CLINIC_OR_DEPARTMENT_OTHER): Payer: Self-pay

## 2023-02-05 LAB — CUP PACEART REMOTE DEVICE CHECK
Date Time Interrogation Session: 20241026230148
Implantable Pulse Generator Implant Date: 20210105

## 2023-02-11 ENCOUNTER — Ambulatory Visit (INDEPENDENT_AMBULATORY_CARE_PROVIDER_SITE_OTHER): Payer: Medicare (Managed Care)

## 2023-02-11 DIAGNOSIS — I639 Cerebral infarction, unspecified: Secondary | ICD-10-CM

## 2023-02-26 NOTE — Progress Notes (Signed)
Carelink Summary Report / Loop Recorder 

## 2023-03-08 ENCOUNTER — Ambulatory Visit (INDEPENDENT_AMBULATORY_CARE_PROVIDER_SITE_OTHER): Payer: Medicare (Managed Care)

## 2023-03-08 DIAGNOSIS — I639 Cerebral infarction, unspecified: Secondary | ICD-10-CM

## 2023-03-13 ENCOUNTER — Ambulatory Visit (INDEPENDENT_AMBULATORY_CARE_PROVIDER_SITE_OTHER): Payer: Medicare (Managed Care) | Admitting: Pulmonary Disease

## 2023-03-13 ENCOUNTER — Encounter (HOSPITAL_BASED_OUTPATIENT_CLINIC_OR_DEPARTMENT_OTHER): Payer: Self-pay | Admitting: Pulmonary Disease

## 2023-03-13 VITALS — BP 112/82 | HR 88 | Resp 16 | Ht 65.0 in | Wt 186.0 lb

## 2023-03-13 DIAGNOSIS — D869 Sarcoidosis, unspecified: Secondary | ICD-10-CM

## 2023-03-13 LAB — PULMONARY FUNCTION TEST
DL/VA % pred: 103 %
DL/VA: 4.35 ml/min/mmHg/L
DLCO cor % pred: 53 %
DLCO cor: 11.23 ml/min/mmHg
DLCO unc % pred: 51 %
DLCO unc: 10.68 ml/min/mmHg
FEF 25-75 Post: 1.46 L/s
FEF 25-75 Pre: 1.66 L/s
FEF2575-%Change-Post: -11 %
FEF2575-%Pred-Post: 60 %
FEF2575-%Pred-Pre: 68 %
FEV1-%Change-Post: -2 %
FEV1-%Pred-Post: 60 %
FEV1-%Pred-Pre: 62 %
FEV1-Post: 1.61 L
FEV1-Pre: 1.65 L
FEV1FVC-%Change-Post: 4 %
FEV1FVC-%Pred-Pre: 103 %
FEV6-%Change-Post: -6 %
FEV6-%Pred-Post: 57 %
FEV6-%Pred-Pre: 61 %
FEV6-Post: 1.92 L
FEV6-Pre: 2.04 L
FEV6FVC-%Pred-Post: 103 %
FEV6FVC-%Pred-Pre: 103 %
FVC-%Change-Post: -6 %
FVC-%Pred-Post: 55 %
FVC-%Pred-Pre: 59 %
FVC-Post: 1.92 L
FVC-Pre: 2.04 L
Post FEV1/FVC ratio: 84 %
Post FEV6/FVC ratio: 100 %
Pre FEV1/FVC ratio: 81 %
Pre FEV6/FVC Ratio: 100 %
RV % pred: 29 %
RV: 0.59 L
TLC % pred: 49 %
TLC: 2.56 L

## 2023-03-13 NOTE — Patient Instructions (Signed)
Full PFT Performed Today  

## 2023-03-13 NOTE — Progress Notes (Signed)
Full PFT Performed Today  

## 2023-03-13 NOTE — Patient Instructions (Signed)
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 Repeat PFTs today with stable restriction -Repeat pulmonary function tests in 12 months (Dec 2025)

## 2023-03-13 NOTE — Progress Notes (Signed)
Subjective:   PATIENT ID: Kathleen George GENDER: female DOB: July 30, 1960, MRN: [id number]   HPI  Chief Complaint  Patient presents with   Follow-up    Sarcoidosis-    Reason for Visit: Follow-up sarcoid  [name] is a 62 year old female with sarcoid, cryptogenic stroke with residual right hemiparesis, DM2, HTN, left prosthetic eye globe who presents follow-up sarcoid management.  History limited due to patient's stroke and health literacy. Patient declined for additional family members to be involved in interview.   Synopsis: She was previously seen by Dr. Sherene Sires in Pulmonary clinic on 01/30/21 for sarcoid. She has a history of ocular involvement of sarcoid previously on steroids but has been off since 2020. Last appointment in November 14, 2020 at [facility] with Dr. 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 She denies any pulmonary issues, no shortness of breath, cough or wheezing. She currently lives with her daughter. Ambulates with a walker at baseline. She prefers to maintain as much independence as possible.  06/19/21 She completed PFTs. Denies shortness, cough or wheezing. Denies chest pain or lower extremity swelling.  12/29/21 Since our last visit she reports overall doing well. No cough, shortness of breath or wheezing. Denies fatigue, night sweats, unexplained fevers/chills. Unchanged visual blurriness and followed by [facility] for sarcoid related uveitis. Note from 5/31 reviewed.  Severe right uveitic glaucoma. Currently on eye drops.  Ambulates with wheelchair due to her right sided stroke.  06/08/22 Since last visit she reports she is doing well. Denies cough, shortness of breath or wheezing. She was seen by [facility] ophthalmology  03/13/23 Since our last visit she denies shortness of breath, cough or wheezing. Overall doing well.  Social History: Former smoker. Quit in 2002.  Past Medical History:  Diagnosis  Date   Diabetes mellitus without complication (HCC)    Glaucoma    Hypercholesterolemia    Hypertension    Prosthetic eye globe    Stroke (HCC)      Family History  Problem Relation Age of Onset   Hypertension Mother    Hypertension Father      Social History   Occupational History   Not on file  Tobacco Use   Smoking status: Former    Current packs/day: 0.00    Types: Cigarettes    Quit date: 2002    Years since quitting: 22.9   Smokeless tobacco: Never  Substance and Sexual Activity   Alcohol use: Not Currently   Drug use: Never   Sexual activity: Not on file    Allergies  Allergen Reactions   Penicillins Anaphylaxis    Did it involve swelling of the face/tongue/throat, SOB, or low BP? Yes Did it involve sudden or severe rash/hives, skin peeling, or any reaction on the inside of your mouth or nose? N/A Did you need to seek medical attention at a hospital or doctor's office? N/A When did it last happen? Child   If all above answers are "NO", may proceed with cephalosporin use.     Outpatient Medications Prior to Visit  Medication Sig Dispense Refill   aspirin EC 81 MG EC tablet Take 1 tablet (81 mg total) by mouth daily. 30 tablet 0   atorvastatin (LIPITOR) 80 MG tablet Take 1 tablet (80 mg total) by mouth daily at 6 PM. 30 tablet 0   dorzolamide-timolol (COSOPT) 22.3-6.8 MG/ML ophthalmic solution Place 1 drop into the right eye 2 (two) times  daily.     erythromycin ophthalmic ointment Place 1 application into the left eye at bedtime.     insulin glargine (LANTUS) 100 UNIT/ML injection Inject into the skin.     levETIRAcetam (KEPPRA) 500 MG tablet Take by mouth.     linagliptin (TRADJENTA) 5 MG TABS tablet Take by mouth.     liraglutide (VICTOZA) 18 MG/3ML SOPN Inject 1.2 mg into the skin every evening.     lisinopril (ZESTRIL) 20 MG tablet Take by mouth.     polyethylene glycol (MIRALAX) 17 g packet Take 17 g by mouth daily. 14 each 0   No  facility-administered medications prior to visit.    Review of Systems  Constitutional:  Negative for chills, diaphoresis, fever, malaise/fatigue and weight loss.  HENT:  Negative for congestion.   Respiratory:  Negative for cough, hemoptysis, sputum production, shortness of breath and wheezing.   Cardiovascular:  Negative for chest pain, palpitations and leg swelling.     Objective:   Vitals:   03/13/23 1030  BP: 112/82  Pulse: 88  Resp: 16  SpO2: 99%  Weight: 186 lb (84.4 kg)  Height: 5\' 5"  (1.651 m)   SpO2: 99 %  Physical Exam: General: Chronically ill-appearing, no acute distress HENT: [place], AT, left eye prosthesis Eyes: EOMI, no scleral icterus Respiratory: Clear to auscultation bilaterally.  No crackles, wheezing or rales Cardiovascular: RRR, -M/R/G, no JVD Extremities:-Edema,-tenderness Neuro: AAO x4, CNII-XII grossly intact Psych: Normal mood, normal affect. Word finding, slurred/dysarthric   Data Reviewed:  Imaging: CXR 01/30/21 -Parenchymal scarring with reticular opacities bilaterally, mildly progressed compared to 03/2019  PFT: 06/19/21 FVC 1.94 (69%) FEV1 1.68 (76%) Ratio 87  TLC 59% DLCO 56% Interpretation: Mild restrictive defect with moderately reduced gas exchange  06/08/22 FVC 1.98 (57%) FEV1 1.68 (63%) Ratio 85  TLC 67% DLCO 54%. No significant BD response Interpretation: Moderate restrictive defect with moderately reduced gas exchange  03/13/23 FVC 1.92 (55%) FEV1 1.61 (60%) Ratio 81  TLC 49% DLCO 51% Interpretation: Moderate restrictive with moderately reduced gas exchange.   Labs: CBC    Component Value Date/Time   WBC 8.6 12/04/2022 1610   RBC 4.31 12/04/2022 1610   HGB 13.0 12/04/2022 1610   HGB 12.7 06/08/2022 1309   HCT 39.6 12/04/2022 1610   HCT 39.1 06/08/2022 1309   PLT 349 12/04/2022 1610   MCV 91.9 12/04/2022 1610   MCV 89 06/08/2022 1309   MCH 30.2 12/04/2022 1610   MCHC 32.8 12/04/2022 1610   RDW 12.6 12/04/2022 1610    RDW 12.3 06/08/2022 1309   LYMPHSABS 3.6 12/04/2022 1610   LYMPHSABS 3.2 (H) 06/08/2022 1309   MONOABS 0.7 12/04/2022 1610   EOSABS 0.2 12/04/2022 1610   EOSABS 0.1 06/08/2022 1309   BASOSABS 0.0 12/04/2022 1610   BASOSABS 0.0 06/08/2022 1309      Latest Ref Rng & Units 12/04/2022    4:10 PM 11/29/2022    4:33 PM 06/08/2022    1:09 PM  CBC  WBC 4.0 - 10.5 K/uL 8.6  9.2  7.8   Hemoglobin 12.0 - 15.0 g/dL 30.8  65.7  84.6   Hematocrit 36.0 - 46.0 % 39.6  37.5  39.1   Platelets 150 - 400 K/uL 349  324      Anemia, likely chronic     Latest Ref Rng & Units 12/04/2022    4:10 PM 11/29/2022    4:33 PM 06/08/2022    1:09 PM  CMP  Glucose 70 - 99  mg/dL 161  096  99   BUN 6 - 20 mg/dL 7  12  13    Creatinine 0.44 - 1.00 mg/dL 0.45  4.09  8.11   Sodium 135 - 145 mmol/L 137  138  145   Potassium 3.5 - 5.1 mmol/L 4.1  3.6  4.8   Chloride 98 - 111 mmol/L 101  104  105   CO2 22 - 32 mmol/L 26  26  25    Calcium 8.9 - 10.3 mg/dL 9.2  9.4  91.4   Total Protein 6.5 - 8.1 g/dL 7.5  8.0    Total Bilirubin 0.3 - 1.2 mg/dL 0.3  0.3    Alkaline Phos 38 - 126 U/L 101  105    AST 15 - 41 U/L 17  14    ALT 0 - 44 U/L 17  17     CBC and CMET on 06/19/21 reviewed and normal lytes and enzymes     Assessment & Plan:   Discussion: 62 year old female with sarcoid, cryptogenic stroke with residual hemiparesis, s/p left prosthetic eye globe, DM2 and HTN who presents for follow-up. History limited due to patient's dysarthria. Patient declined for additional family members to be involved in interview. Pulmonary sarcoid with stable restrictive defect. Hold on immunosuppressants for sarcoid at this time. Patient prefers to avoid for as long as possible  We discussed the clinical course of sarcoid and management including serial PFTs, labs, eye exam, and EKG and chest imaging if indicated. If symptoms suggest sarcoid flare in the future, we would manage with steroids +/- biologics.   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 Repeat PFTs with stable restriction -Repeat pulmonary function tests in 12 months (Dec 2025) - If worsening in the future, will need to consider immunosuppression. Will need to contact daughter with patient's permission - Annual ophthalmology exam. Discuss with [facility] regarding active/stable disease - QuantiFERON-TB needed if planning to initiate immunosuppressants in the future - Routine labs: CBC with diff, CMET  Ocular Sarcoid --Followed at [facility]. Last visit on 05/2022. Stable IOP on Cosopt  Health Maintenance  There is no immunization history on file for this patient. CT Lung Screen - not qualified. Quit >15 years  Orders Placed This Encounter  Procedures   Pulmonary function test    Standing Status:   Future    Standing Expiration Date:   03/12/2024    Order Specific Question:   Where should this test be performed?    Answer:   [facility]    Order Specific Question:   Full PFT: includes the following: basic spirometry, spirometry pre & post bronchodilator, diffusion capacity (DLCO), lung volumes    Answer:   Full PFT  No orders of the defined types were placed in this encounter.   Return in about 1 year (around 03/12/2024) for after PFT.   I have spent a total time of 30-minutes on the day of the appointment including chart review, data review, collecting history, coordinating care and discussing medical diagnosis and plan with the patient/family. Past medical history, allergies, medications were reviewed. Pertinent imaging, labs and tests included in this note have been reviewed and interpreted independently by me.  Trenell Moxey Mechele Collin, MD [facility] 03/13/2023  Office Number [phone]

## 2023-03-15 DIAGNOSIS — I639 Cerebral infarction, unspecified: Secondary | ICD-10-CM | POA: Diagnosis not present

## 2023-03-18 ENCOUNTER — Ambulatory Visit: Payer: Medicare (Managed Care)

## 2023-04-11 ENCOUNTER — Ambulatory Visit (INDEPENDENT_AMBULATORY_CARE_PROVIDER_SITE_OTHER): Payer: Medicare (Managed Care)

## 2023-04-11 DIAGNOSIS — I639 Cerebral infarction, unspecified: Secondary | ICD-10-CM

## 2023-04-11 LAB — CUP PACEART REMOTE DEVICE CHECK
Date Time Interrogation Session: 20250101230149
Implantable Pulse Generator Implant Date: 20210105

## 2023-04-18 DIAGNOSIS — I639 Cerebral infarction, unspecified: Secondary | ICD-10-CM | POA: Diagnosis not present

## 2023-04-22 ENCOUNTER — Ambulatory Visit: Payer: Medicare (Managed Care)

## 2023-05-16 ENCOUNTER — Ambulatory Visit (INDEPENDENT_AMBULATORY_CARE_PROVIDER_SITE_OTHER): Payer: Medicare (Managed Care)

## 2023-05-16 DIAGNOSIS — I639 Cerebral infarction, unspecified: Secondary | ICD-10-CM

## 2023-05-16 LAB — CUP PACEART REMOTE DEVICE CHECK
Date Time Interrogation Session: 20250205230152
Implantable Pulse Generator Implant Date: 20210105

## 2023-05-22 NOTE — Progress Notes (Signed)
[facility] Summary Report / Loop Recorder

## 2023-05-23 DIAGNOSIS — I639 Cerebral infarction, unspecified: Secondary | ICD-10-CM | POA: Diagnosis not present

## 2023-06-19 NOTE — Progress Notes (Signed)
 [facility] Summary Report / Loop Recorder

## 2023-06-20 ENCOUNTER — Ambulatory Visit (INDEPENDENT_AMBULATORY_CARE_PROVIDER_SITE_OTHER): Payer: Medicare (Managed Care)

## 2023-06-20 DIAGNOSIS — I639 Cerebral infarction, unspecified: Secondary | ICD-10-CM

## 2023-06-20 LAB — CUP PACEART REMOTE DEVICE CHECK
Date Time Interrogation Session: 20250312230111
Implantable Pulse Generator Implant Date: 20210105

## 2023-06-27 DIAGNOSIS — I639 Cerebral infarction, unspecified: Secondary | ICD-10-CM

## 2023-07-25 ENCOUNTER — Ambulatory Visit (INDEPENDENT_AMBULATORY_CARE_PROVIDER_SITE_OTHER): Payer: Medicare (Managed Care)

## 2023-07-25 DIAGNOSIS — I639 Cerebral infarction, unspecified: Secondary | ICD-10-CM

## 2023-07-25 LAB — CUP PACEART REMOTE DEVICE CHECK
Date Time Interrogation Session: 20250416230200
Implantable Pulse Generator Implant Date: 20210105

## 2023-08-01 DIAGNOSIS — I639 Cerebral infarction, unspecified: Secondary | ICD-10-CM | POA: Diagnosis not present

## 2023-08-02 NOTE — Progress Notes (Signed)
 [facility] Summary Report / Loop Recorder

## 2023-08-08 ENCOUNTER — Other Ambulatory Visit: Payer: Self-pay | Admitting: Internal Medicine

## 2023-08-08 ENCOUNTER — Other Ambulatory Visit: Payer: Self-pay

## 2023-08-08 DIAGNOSIS — Z1231 Encounter for screening mammogram for malignant neoplasm of breast: Secondary | ICD-10-CM

## 2023-08-27 ENCOUNTER — Ambulatory Visit
Admission: RE | Admit: 2023-08-27 | Discharge: 2023-08-27 | Disposition: A | Discharge status: Home / Self Care | Payer: Medicare (Managed Care) | Source: Ambulatory Visit

## 2023-08-27 DIAGNOSIS — Z1231 Encounter for screening mammogram for malignant neoplasm of breast: Secondary | ICD-10-CM

## 2023-08-29 ENCOUNTER — Ambulatory Visit (INDEPENDENT_AMBULATORY_CARE_PROVIDER_SITE_OTHER): Payer: Medicare (Managed Care)

## 2023-08-29 ENCOUNTER — Ambulatory Visit: Payer: Self-pay | Admitting: Cardiology

## 2023-08-29 DIAGNOSIS — I639 Cerebral infarction, unspecified: Secondary | ICD-10-CM

## 2023-08-29 LAB — CUP PACEART REMOTE DEVICE CHECK
Date Time Interrogation Session: 20250521230409
Implantable Pulse Generator Implant Date: 20210105

## 2023-09-04 NOTE — Progress Notes (Signed)
 [facility] Summary Report / Loop Recorder

## 2023-09-05 DIAGNOSIS — I639 Cerebral infarction, unspecified: Secondary | ICD-10-CM

## 2023-09-28 ENCOUNTER — Ambulatory Visit (INDEPENDENT_AMBULATORY_CARE_PROVIDER_SITE_OTHER): Payer: Medicare (Managed Care)

## 2023-09-28 DIAGNOSIS — I639 Cerebral infarction, unspecified: Secondary | ICD-10-CM

## 2023-09-30 LAB — CUP PACEART REMOTE DEVICE CHECK
Date Time Interrogation Session: 20250620230953
Implantable Pulse Generator Implant Date: 20210105

## 2023-10-02 ENCOUNTER — Ambulatory Visit: Payer: Self-pay | Admitting: Cardiology

## 2023-10-03 ENCOUNTER — Ambulatory Visit: Payer: Medicare (Managed Care)

## 2023-10-05 DIAGNOSIS — I639 Cerebral infarction, unspecified: Secondary | ICD-10-CM | POA: Diagnosis not present

## 2023-10-08 NOTE — Progress Notes (Signed)
 [facility] Summary Report / Loop Recorder

## 2023-10-29 ENCOUNTER — Ambulatory Visit: Payer: Medicare (Managed Care)

## 2023-10-29 DIAGNOSIS — I639 Cerebral infarction, unspecified: Secondary | ICD-10-CM

## 2023-10-30 LAB — CUP PACEART REMOTE DEVICE CHECK
Date Time Interrogation Session: 20250721230854
Implantable Pulse Generator Implant Date: 20210105

## 2023-11-04 ENCOUNTER — Ambulatory Visit: Payer: Self-pay | Admitting: Cardiology

## 2023-11-06 NOTE — Progress Notes (Signed)
 [facility] Summary Report / Loop Recorder

## 2023-11-06 NOTE — Addendum Note (Signed)
 Addended by: TAWNI DRILLING D on: 11/06/2023 02:04 PM   Modules accepted: Orders

## 2023-11-07 ENCOUNTER — Ambulatory Visit: Payer: Medicare (Managed Care)

## 2023-11-29 ENCOUNTER — Ambulatory Visit: Payer: Medicare (Managed Care)

## 2023-11-29 ENCOUNTER — Ambulatory Visit: Payer: Self-pay | Admitting: Cardiology

## 2023-11-29 DIAGNOSIS — I639 Cerebral infarction, unspecified: Secondary | ICD-10-CM | POA: Diagnosis not present

## 2023-11-29 LAB — CUP PACEART REMOTE DEVICE CHECK
Date Time Interrogation Session: 20250821230750
Implantable Pulse Generator Implant Date: 20210105

## 2023-12-12 ENCOUNTER — Ambulatory Visit: Payer: Medicare (Managed Care)

## 2023-12-25 NOTE — Progress Notes (Signed)
 Remote Loop Recorder Transmission

## 2023-12-30 ENCOUNTER — Ambulatory Visit (INDEPENDENT_AMBULATORY_CARE_PROVIDER_SITE_OTHER): Payer: Medicare (Managed Care)

## 2023-12-30 ENCOUNTER — Ambulatory Visit: Payer: Self-pay | Admitting: Cardiology

## 2023-12-30 DIAGNOSIS — I639 Cerebral infarction, unspecified: Secondary | ICD-10-CM

## 2023-12-30 LAB — CUP PACEART REMOTE DEVICE CHECK
Date Time Interrogation Session: 20250921230155
Implantable Pulse Generator Implant Date: 20210105

## 2023-12-31 NOTE — Progress Notes (Signed)
 Remote Loop Recorder Transmission

## 2024-01-10 NOTE — Progress Notes (Signed)
 Remote Loop Recorder Transmission

## 2024-01-16 ENCOUNTER — Ambulatory Visit: Payer: Medicare (Managed Care)

## 2024-01-16 NOTE — Progress Notes (Signed)
 This encounter was created in error - please disregard.

## 2024-01-28 ENCOUNTER — Ambulatory Visit: Payer: Medicare (Managed Care)

## 2024-01-30 ENCOUNTER — Ambulatory Visit: Payer: Medicare (Managed Care)

## 2024-01-30 DIAGNOSIS — I639 Cerebral infarction, unspecified: Secondary | ICD-10-CM

## 2024-01-30 LAB — CUP PACEART REMOTE DEVICE CHECK
Date Time Interrogation Session: 20251022230404
Implantable Pulse Generator Implant Date: 20210105

## 2024-01-31 ENCOUNTER — Ambulatory Visit: Payer: Self-pay | Admitting: Cardiology

## 2024-01-31 NOTE — Progress Notes (Signed)
 Remote Loop Recorder Transmission

## 2024-02-20 ENCOUNTER — Ambulatory Visit: Payer: Medicare (Managed Care)

## 2024-02-28 ENCOUNTER — Ambulatory Visit: Payer: Medicare (Managed Care)

## 2024-03-01 ENCOUNTER — Ambulatory Visit: Payer: Medicare (Managed Care)

## 2024-03-01 DIAGNOSIS — I639 Cerebral infarction, unspecified: Secondary | ICD-10-CM

## 2024-03-02 LAB — CUP PACEART REMOTE DEVICE CHECK
Date Time Interrogation Session: 20251122230524
Implantable Pulse Generator Implant Date: 20210105

## 2024-03-03 NOTE — Progress Notes (Signed)
 Remote Loop Recorder Transmission

## 2024-03-04 ENCOUNTER — Ambulatory Visit: Payer: Self-pay | Admitting: Cardiology

## 2024-03-09 ENCOUNTER — Telehealth (HOSPITAL_BASED_OUTPATIENT_CLINIC_OR_DEPARTMENT_OTHER): Payer: Self-pay

## 2024-03-09 ENCOUNTER — Ambulatory Visit (HOSPITAL_BASED_OUTPATIENT_CLINIC_OR_DEPARTMENT_OTHER): Payer: Medicare (Managed Care) | Admitting: Pulmonary Disease

## 2024-03-09 ENCOUNTER — Encounter (HOSPITAL_BASED_OUTPATIENT_CLINIC_OR_DEPARTMENT_OTHER): Payer: Medicare (Managed Care)

## 2024-03-09 DIAGNOSIS — D869 Sarcoidosis, unspecified: Secondary | ICD-10-CM

## 2024-03-09 NOTE — Telephone Encounter (Unsigned)
 Copied from CRM #[id number]. Topic: Clinical - Request for Lab/Test Order >> Mar 09, 2024 12:43 PM Joesph PARAS wrote: Reason for CRM: [facility] c/b [phone] calling to request to reschedule pft and f/u. New order for PFT needs to be placed before appointment can be rescheduled. Please reach back out to [facility] to r/s patient.

## 2024-03-26 ENCOUNTER — Ambulatory Visit: Payer: Medicare (Managed Care)

## 2024-03-30 ENCOUNTER — Ambulatory Visit: Payer: Medicare (Managed Care)

## 2024-04-01 ENCOUNTER — Ambulatory Visit: Payer: Medicare (Managed Care)

## 2024-04-01 DIAGNOSIS — I639 Cerebral infarction, unspecified: Secondary | ICD-10-CM | POA: Diagnosis not present

## 2024-04-01 LAB — CUP PACEART REMOTE DEVICE CHECK
Date Time Interrogation Session: 20251223230149
Implantable Pulse Generator Implant Date: 20210105

## 2024-04-03 ENCOUNTER — Ambulatory Visit: Payer: Self-pay | Admitting: Cardiology

## 2024-04-03 NOTE — Progress Notes (Signed)
 Remote Loop Recorder Transmission

## 2024-04-30 ENCOUNTER — Ambulatory Visit: Payer: Medicare (Managed Care)

## 2024-05-02 ENCOUNTER — Ambulatory Visit: Payer: Medicare (Managed Care) | Attending: Cardiology

## 2024-05-02 DIAGNOSIS — I639 Cerebral infarction, unspecified: Secondary | ICD-10-CM | POA: Diagnosis not present

## 2024-05-04 LAB — CUP PACEART REMOTE DEVICE CHECK
Date Time Interrogation Session: 20260123230045
Implantable Pulse Generator Implant Date: 20210105

## 2024-05-05 ENCOUNTER — Ambulatory Visit: Payer: Self-pay | Admitting: Cardiology

## 2024-05-06 ENCOUNTER — Ambulatory Visit: Payer: Medicare (Managed Care) | Admitting: Podiatry

## 2024-05-07 NOTE — Progress Notes (Signed)
 Remote Loop Recorder Transmission

## 2024-05-21 ENCOUNTER — Ambulatory Visit: Payer: Medicare (Managed Care) | Admitting: Podiatry

## 2024-05-31 ENCOUNTER — Ambulatory Visit: Payer: Medicare (Managed Care)

## 2024-06-02 ENCOUNTER — Ambulatory Visit: Payer: Medicare (Managed Care)

## 2024-07-03 ENCOUNTER — Ambulatory Visit: Payer: Medicare (Managed Care)

## 2024-08-03 ENCOUNTER — Ambulatory Visit: Payer: Medicare (Managed Care)
